# Patient Record
Sex: Male | Born: 1980 | Race: Black or African American | Hispanic: No | Marital: Single | Smoking: Current every day smoker
Health system: Southern US, Community
[De-identification: ages and names within clinical notes are randomized; demographics above are authoritative.]

## PROBLEM LIST (undated history)

## (undated) DIAGNOSIS — M549 Dorsalgia, unspecified: Secondary | ICD-10-CM

## (undated) HISTORY — PX: HERNIA REPAIR: SHX51

## (undated) HISTORY — DX: Dorsalgia, unspecified: M54.9

---
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
## 2000-12-28 ENCOUNTER — Emergency Department (HOSPITAL_COMMUNITY): Admission: EM | Admit: 2000-12-28 | Discharge: 2000-12-28 | Payer: Self-pay | Admitting: Emergency Medicine

## 2002-07-22 ENCOUNTER — Emergency Department (HOSPITAL_COMMUNITY): Admission: EM | Admit: 2002-07-22 | Discharge: 2002-07-22 | Payer: Self-pay | Admitting: Emergency Medicine

## 2003-02-08 ENCOUNTER — Emergency Department (HOSPITAL_COMMUNITY): Admission: EM | Admit: 2003-02-08 | Discharge: 2003-02-08 | Payer: Self-pay | Admitting: Emergency Medicine

## 2004-08-28 ENCOUNTER — Emergency Department (HOSPITAL_COMMUNITY): Admission: EM | Admit: 2004-08-28 | Discharge: 2004-08-28 | Payer: Self-pay | Admitting: Emergency Medicine

## 2005-02-18 ENCOUNTER — Emergency Department (HOSPITAL_COMMUNITY): Admission: EM | Admit: 2005-02-18 | Discharge: 2005-02-18 | Payer: Self-pay | Admitting: Emergency Medicine

## 2005-06-17 ENCOUNTER — Emergency Department (HOSPITAL_COMMUNITY): Admission: EM | Admit: 2005-06-17 | Discharge: 2005-06-17 | Payer: Self-pay | Admitting: Emergency Medicine

## 2007-01-10 ENCOUNTER — Emergency Department (HOSPITAL_COMMUNITY): Admission: EM | Admit: 2007-01-10 | Discharge: 2007-01-10 | Payer: Self-pay | Admitting: Emergency Medicine

## 2008-06-27 ENCOUNTER — Emergency Department (HOSPITAL_COMMUNITY): Admission: EM | Admit: 2008-06-27 | Discharge: 2008-06-27 | Payer: Self-pay | Admitting: Family Medicine

## 2012-10-28 ENCOUNTER — Encounter (HOSPITAL_COMMUNITY): Payer: Self-pay | Admitting: Emergency Medicine

## 2012-10-28 ENCOUNTER — Emergency Department (HOSPITAL_COMMUNITY)
Admission: EM | Admit: 2012-10-28 | Discharge: 2012-10-28 | Disposition: A | Discharge status: Home / Self Care | Payer: BC Managed Care – PPO | Attending: Emergency Medicine | Admitting: Emergency Medicine

## 2012-10-28 ENCOUNTER — Emergency Department (HOSPITAL_COMMUNITY): Payer: BC Managed Care – PPO

## 2012-10-28 DIAGNOSIS — R079 Chest pain, unspecified: Secondary | ICD-10-CM | POA: Insufficient documentation

## 2012-10-28 DIAGNOSIS — F17200 Nicotine dependence, unspecified, uncomplicated: Secondary | ICD-10-CM | POA: Insufficient documentation

## 2012-10-28 DIAGNOSIS — R0602 Shortness of breath: Secondary | ICD-10-CM | POA: Insufficient documentation

## 2012-10-28 LAB — POCT I-STAT TROPONIN I: Troponin i, poc: 0.01 ng/mL (ref 0.00–0.08)

## 2012-10-28 LAB — CBC
HCT: 44.7 % (ref 39.0–52.0)
Hemoglobin: 16.3 g/dL (ref 13.0–17.0)
MCH: 32.5 pg (ref 26.0–34.0)
MCHC: 36.5 g/dL — ABNORMAL HIGH (ref 30.0–36.0)
MCV: 89.2 fL (ref 78.0–100.0)
Platelets: 237 10*3/uL (ref 150–400)
RBC: 5.01 MIL/uL (ref 4.22–5.81)
RDW: 12.6 % (ref 11.5–15.5)
WBC: 4.9 10*3/uL (ref 4.0–10.5)

## 2012-10-28 LAB — BASIC METABOLIC PANEL
BUN: 11 mg/dL (ref 6–23)
CO2: 28 mEq/L (ref 19–32)
Calcium: 9.5 mg/dL (ref 8.4–10.5)
Chloride: 101 mEq/L (ref 96–112)
Creatinine, Ser: 0.99 mg/dL (ref 0.50–1.35)
GFR calc Af Amer: >90 mL/min (ref >90)
GFR calc non Af Amer: >90 mL/min (ref >90)
Glucose, Bld: 86 mg/dL (ref 70–99)
Potassium: 3.9 mEq/L (ref 3.5–5.1)
Sodium: 138 mEq/L (ref 135–145)

## 2012-10-28 LAB — PRO B NATRIURETIC PEPTIDE: Pro B Natriuretic peptide (BNP): <5 pg/mL (ref 0–125)

## 2012-10-28 MED ORDER — SIMETHICONE 80 MG PO CHEW
80.0000 mg | CHEWABLE_TABLET | Freq: Once | ORAL | Status: AC
  Administered 2012-10-28: 80 mg via ORAL
  Filled 2012-10-28: qty 1

## 2012-10-28 MED ORDER — GI COCKTAIL ~~LOC~~
30.0000 mL | Freq: Once | ORAL | Status: AC
  Administered 2012-10-28: 30 mL via ORAL
  Filled 2012-10-28: qty 30

## 2012-10-28 NOTE — ED Provider Notes (Signed)
  Medical screening examination/treatment/procedure(s) were performed by non-physician practitioner and as supervising physician I was immediately available for consultation/collaboration.    Gerhard Munch, MD 10/28/12 [phone]

## 2012-10-28 NOTE — ED Notes (Signed)
Patient states that he came into ED with chest pain that is now mostly resolved.  Patient states if he presses on his L chest the pain is reproducible.  Patient does state that he has been under a lot of stress lately and wonders if that is a contributor.

## 2012-10-28 NOTE — ED Provider Notes (Signed)
CSN: [id number]     Arrival date & time 10/28/12  0223 History     None    Chief Complaint  Patient presents with  . Chest Pain   (Consider location/radiation/quality/duration/timing/severity/associated sxs/prior Treatment) The history is provided by the patient and medical records.   Patient presents today for left-sided chest pain which woke him from sleep. Pain described as an intermittent sharp, stabbing sensation associated with some SOB when pain occurs.  Episodes are short lived, lasting on a few seconds before resolving.  No palpitations, dizziness, weakness, nausea, vomiting, or diaphoresis.  Patient notes he has had increased indigestion and gas recently due to changes in diet.  No significant personal or family history of coronary disease or MI.  Is a current every day smoker.  No prior episodes of similar pain. No recent travel or LE edema. No strenuous active to cause muscle strain.  No falls or trauma to chest wall. No meds taken prior to arrival.  History reviewed. No pertinent past medical history. History reviewed. No pertinent past surgical history. No family history on file. History  Substance Use Topics  . Smoking status: Current Every Day Smoker  . Smokeless tobacco: Not on file  . Alcohol Use: Yes    Review of Systems  Cardiovascular: Positive for chest pain.  All other systems reviewed and are negative.    Allergies  Review of patient's allergies indicates no known allergies.  Home Medications  No current outpatient prescriptions on file. BP 133/96  Pulse 66  Temp(Src) 98.1 F (36.7 C) (Oral)  Resp 21  SpO2 100% Physical Exam  Nursing note and vitals reviewed. Constitutional: He is oriented to person, place, and time. He appears well-developed and well-nourished.  HENT:  Head: Normocephalic and atraumatic.  Mouth/Throat: Oropharynx is clear and moist.  Eyes: Conjunctivae and EOM are normal. Pupils are equal, round, and reactive to light.  Neck:  Normal range of motion.  Cardiovascular: Normal rate, regular rhythm and normal heart sounds.   Pulmonary/Chest: Effort normal and breath sounds normal. He exhibits tenderness. He exhibits no bony tenderness, no crepitus, no deformity, no swelling and no retraction.    Chest pain reproducible with palpation to left chest wall  Abdominal: Soft. Bowel sounds are normal. There is tenderness in the left upper quadrant. There is no rigidity, no guarding, no CVA tenderness, no tenderness at McBurney's point and negative Murphy's sign.  Musculoskeletal: Normal range of motion. He exhibits no edema.  Neurological: He is alert and oriented to person, place, and time.  Skin: Skin is warm and dry.  Psychiatric: He has a normal mood and affect.    ED Course   Procedures (including critical care time)   Date: 10/28/2012  Rate: 82  Rhythm: normal sinus rhythm  QRS Axis: normal  Intervals: normal  ST/T Wave abnormalities: normal  Conduction Disutrbances:none  Narrative Interpretation: NSR, no STEMI  Old EKG Reviewed: none available    Labs Reviewed  CBC - Abnormal; Notable for the following:    MCHC 36.5 (*)    All other components within normal limits  BASIC METABOLIC PANEL  PRO B NATRIURETIC PEPTIDE  POCT I-STAT TROPONIN I   Dg Chest 2 View  10/28/2012   *RADIOLOGY REPORT*  Clinical Data: Chest pain.  Shortness of breath.  CHEST - 2 VIEW  Comparison: None.  Findings: Lungs are clear.  Heart size is normal.  No pneumothorax or pleural fluid.  IMPRESSION: Negative chest.   Original Report Authenticated By: Holley Dexter,  M.D.   1. Chest pain     MDM   EKG NSR, no acute ischemic changes.  Trop negative.  CXR clear.  Labs largely WNL.  Chest pain reproducible and with pts mention of recent indigestion and diet changes, i suspect that pain may be GI related.  7:49 AM Pt states pain has improved with GI cocktail and Gas-x.  Low suspicion that CP is cardiac in nature-- doubt ACS, PE,  dissection, or other vascular collapse.  Pt does not have PCP-- given names of local offices and contact info for wellness clinic where he may FU.  Instructed he may continue taking OTC meds for sx.  Discussed plan with pt, he agreed.  Return precautions advised.   Garlon Hatchet, PA-C 10/28/12 1013

## 2012-10-28 NOTE — ED Notes (Signed)
PT. WOKE UP FROM SLEEP WITH LEFT CHEST PAIN , SOB , OCCASIONAL DRY COUGH AND NAUSEA THIS EVENING , PT. TOOK EXEDRIN TABS WITH NO RELIEF.

## 2014-12-01 ENCOUNTER — Encounter (HOSPITAL_COMMUNITY): Payer: Self-pay

## 2014-12-01 ENCOUNTER — Emergency Department (HOSPITAL_COMMUNITY)
Admission: EM | Admit: 2014-12-01 | Discharge: 2014-12-01 | Disposition: A | Discharge status: Home / Self Care | Payer: Medicaid Other | Attending: Emergency Medicine | Admitting: Emergency Medicine

## 2014-12-01 DIAGNOSIS — Z7982 Long term (current) use of aspirin: Secondary | ICD-10-CM | POA: Insufficient documentation

## 2014-12-01 DIAGNOSIS — R1032 Left lower quadrant pain: Secondary | ICD-10-CM | POA: Insufficient documentation

## 2014-12-01 DIAGNOSIS — Z720 Tobacco use: Secondary | ICD-10-CM | POA: Insufficient documentation

## 2014-12-01 LAB — URINALYSIS, ROUTINE W REFLEX MICROSCOPIC
Bilirubin Urine: NEGATIVE
Glucose, UA: NEGATIVE mg/dL
Hgb urine dipstick: NEGATIVE
Ketones, ur: NEGATIVE mg/dL
Leukocytes, UA: NEGATIVE
Nitrite: NEGATIVE
Protein, ur: NEGATIVE mg/dL
Specific Gravity, Urine: 1.022 (ref 1.005–1.030)
Urobilinogen, UA: 0.2 mg/dL (ref 0.0–1.0)
pH: 5.5 (ref 5.0–8.0)

## 2014-12-01 LAB — COMPREHENSIVE METABOLIC PANEL
ALT: 38 U/L (ref 17–63)
AST: 31 U/L (ref 15–41)
Albumin: 4.3 g/dL (ref 3.5–5.0)
Alkaline Phosphatase: 40 U/L (ref 38–126)
Anion gap: 9 (ref 5–15)
BUN: 9 mg/dL (ref 6–20)
CO2: 24 mmol/L (ref 22–32)
Calcium: 9.6 mg/dL (ref 8.9–10.3)
Chloride: 106 mmol/L (ref 101–111)
Creatinine, Ser: 1.03 mg/dL (ref 0.61–1.24)
GFR calc Af Amer: >60 mL/min (ref >60)
GFR calc non Af Amer: >60 mL/min (ref >60)
Glucose, Bld: 98 mg/dL (ref 65–99)
Potassium: 4.2 mmol/L (ref 3.5–5.1)
Sodium: 139 mmol/L (ref 135–145)
Total Bilirubin: 0.7 mg/dL (ref 0.3–1.2)
Total Protein: 6.9 g/dL (ref 6.5–8.1)

## 2014-12-01 LAB — CBC
HCT: 41.7 % (ref 39.0–52.0)
Hemoglobin: 14.3 g/dL (ref 13.0–17.0)
MCH: 31.4 pg (ref 26.0–34.0)
MCHC: 34.3 g/dL (ref 30.0–36.0)
MCV: 91.4 fL (ref 78.0–100.0)
Platelets: 228 10*3/uL (ref 150–400)
RBC: 4.56 MIL/uL (ref 4.22–5.81)
RDW: 12.7 % (ref 11.5–15.5)
WBC: 3.3 10*3/uL — ABNORMAL LOW (ref 4.0–10.5)

## 2014-12-01 LAB — LIPASE, BLOOD: Lipase: 25 U/L (ref 22–51)

## 2014-12-01 MED ORDER — IBUPROFEN 400 MG PO TABS
400.0000 mg | ORAL_TABLET | Freq: Once | ORAL | Status: AC
  Administered 2014-12-01: 400 mg via ORAL
  Filled 2014-12-01: qty 1

## 2014-12-01 MED ORDER — FAMOTIDINE 20 MG PO TABS: 20.0000 mg | ORAL_TABLET | Freq: Two times a day (BID) | ORAL | Status: DC

## 2014-12-01 MED ORDER — FAMOTIDINE 20 MG PO TABS
20.0000 mg | ORAL_TABLET | Freq: Once | ORAL | Status: AC
  Administered 2014-12-01: 20 mg via ORAL
  Filled 2014-12-01: qty 1

## 2014-12-01 NOTE — ED Notes (Signed)
Pt presents with 3 day h/o L sided abdominal pain.  Pt reports pain is constant and will have generalized cramping intermittently.  Pt denies any nausea or vomiting, reports onset of diarrhea today.  Pt denies any dysuria.

## 2014-12-01 NOTE — ED Provider Notes (Signed)
CSN: [id number]     Arrival date & time 12/01/14  1412 History   First MD Initiated Contact with Patient 12/01/14 1425     Chief Complaint  Patient presents with  . Abdominal Pain     (Consider location/radiation/quality/duration/timing/severity/associated sxs/prior Treatment) HPI Patient portion about 3 days she's had pain on the left side of his abdomen. It has been coming and going. It is crampy in nature. He reports initially he thought he had some constipation. He however ate fruit and had a bowel movement yesterday. He reports he took some Pepto-Bismol last night and then had a bowel movement again today. He denies any fever. There is been no pain burning or urgency urination. He denies left flank pain or testicular pain or swelling. History reviewed. No pertinent past medical history. Past Surgical History  Procedure Laterality Date  . Hernia repair     History reviewed. No pertinent family history. Social History  Substance Use Topics  . Smoking status: Current Every Day Smoker  . Smokeless tobacco: None  . Alcohol Use: Yes    Review of Systems 10 Systems reviewed and are negative for acute change except as noted in the HPI.    Allergies  Review of patient's allergies indicates no known allergies.  Home Medications   Prior to Admission medications   Medication Sig Start Date End Date Taking? Authorizing Provider  aspirin-acetaminophen-caffeine (EXCEDRIN MIGRAINE) [phone] MG per tablet Take 2 tablets by mouth every 6 (six) hours as needed for headache.   Yes Historical Provider, MD  bismuth subsalicylate (PEPTO BISMOL) 262 MG/15ML suspension Take 30 mLs by mouth every 6 (six) hours as needed for indigestion.   Yes Historical Provider, MD  famotidine (PEPCID) 20 MG tablet Take 1 tablet (20 mg total) by mouth 2 (two) times daily. 12/01/14   Arby Barrette, MD   BP 119/89 mmHg  Pulse 94  Temp(Src) 98.8 F (37.1 C)  Resp 20  SpO2 98% Physical Exam  Constitutional:  He is oriented to person, place, and time. He appears well-developed and well-nourished.  HENT:  Head: Normocephalic and atraumatic.  Eyes: EOM are normal. Pupils are equal, round, and reactive to light.  Neck: Neck supple.  Cardiovascular: Normal rate, regular rhythm, normal heart sounds and intact distal pulses.   Pulmonary/Chest: Effort normal and breath sounds normal.  Abdominal: Soft. Bowel sounds are normal. He exhibits no distension. There is tenderness.  Mildly reproducible tenderness in the left mid quadrant. No guarding no rebound. No palpable mass. No CVA tenderness.  Musculoskeletal: Normal range of motion. He exhibits no edema.  Neurological: He is alert and oriented to person, place, and time. He has normal strength. Coordination normal. GCS eye subscore is 4. GCS verbal subscore is 5. GCS motor subscore is 6.  Skin: Skin is warm, dry and intact.  Psychiatric: He has a normal mood and affect.    ED Course  Procedures (including critical care time) Labs Review Labs Reviewed  CBC - Abnormal; Notable for the following:    WBC 3.3 (*)    All other components within normal limits  LIPASE, BLOOD  COMPREHENSIVE METABOLIC PANEL  URINALYSIS, ROUTINE W REFLEX MICROSCOPIC (NOT AT [facility])    Imaging Review No results found. I have personally reviewed and evaluated these images and lab results as part of my medical decision-making.   EKG Interpretation None      MDM   Final diagnoses:  Left lower quadrant pain   Patient with several days of left lateral  abdominal pain. At this point time exam is nonsurgical. Patient has not had nausea or vomiting. He is requesting food at this time. This is most likely GI in etiology such as gastritis or colicky pain. Patient is given signs and symptoms for which return.    Arby Barrette, MD 12/01/14 [phone]

## 2014-12-01 NOTE — Discharge Instructions (Signed)

## 2015-05-01 ENCOUNTER — Encounter (HOSPITAL_COMMUNITY): Payer: Self-pay | Admitting: *Deleted

## 2015-05-01 ENCOUNTER — Emergency Department (HOSPITAL_COMMUNITY): Payer: Self-pay

## 2015-05-01 ENCOUNTER — Emergency Department (HOSPITAL_COMMUNITY)
Admission: EM | Admit: 2015-05-01 | Discharge: 2015-05-01 | Disposition: A | Discharge status: Home / Self Care | Payer: Self-pay | Attending: Emergency Medicine | Admitting: Emergency Medicine

## 2015-05-01 DIAGNOSIS — Y92410 Unspecified street and highway as the place of occurrence of the external cause: Secondary | ICD-10-CM | POA: Insufficient documentation

## 2015-05-01 DIAGNOSIS — F17200 Nicotine dependence, unspecified, uncomplicated: Secondary | ICD-10-CM | POA: Insufficient documentation

## 2015-05-01 DIAGNOSIS — S4992XA Unspecified injury of left shoulder and upper arm, initial encounter: Secondary | ICD-10-CM | POA: Insufficient documentation

## 2015-05-01 DIAGNOSIS — Y999 Unspecified external cause status: Secondary | ICD-10-CM | POA: Insufficient documentation

## 2015-05-01 DIAGNOSIS — Z79899 Other long term (current) drug therapy: Secondary | ICD-10-CM | POA: Insufficient documentation

## 2015-05-01 DIAGNOSIS — Y9389 Activity, other specified: Secondary | ICD-10-CM | POA: Insufficient documentation

## 2015-05-01 DIAGNOSIS — M25512 Pain in left shoulder: Secondary | ICD-10-CM

## 2015-05-01 MED ORDER — IBUPROFEN 600 MG PO TABS: 600.0000 mg | ORAL_TABLET | Freq: Four times a day (QID) | ORAL | Status: DC | PRN

## 2015-05-01 NOTE — ED Notes (Signed)
Patient transported to X-ray 

## 2015-05-01 NOTE — ED Notes (Signed)
Declined W/C at D/C and was escorted to lobby by RN. 
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
## 2015-05-01 NOTE — Discharge Instructions (Signed)
1. Medications: ibuprofen, usual home medications 2. Treatment: rest, drink plenty of fluids, ice, elevate, wear sling for comfort 3. Follow Up: please followup with your primary doctor and with orthopedics for discussion of your diagnoses and further evaluation after today's visit; if you do not have a primary care doctor use the resource guide provided to find one; please return to the ER for increased pain, swelling, numbness, new or worsening symptoms   Shoulder Pain The shoulder is the joint that connects your arms to your body. The bones that form the shoulder joint include the upper arm bone (humerus), the shoulder blade (scapula), and the collarbone (clavicle). The top of the humerus is shaped like a ball and fits into a rather flat socket on the scapula (glenoid cavity). A combination of muscles and strong, fibrous tissues that connect muscles to bones (tendons) support your shoulder joint and hold the ball in the socket. Small, fluid-filled sacs (bursae) are located in different areas of the joint. They act as cushions between the bones and the overlying soft tissues and help reduce friction between the gliding tendons and the bone as you move your arm. Your shoulder joint allows a wide range of motion in your arm. This range of motion allows you to do things like scratch your back or throw a ball. However, this range of motion also makes your shoulder more prone to pain from overuse and injury. Causes of shoulder pain can originate from both injury and overuse and usually can be grouped in the following four categories:  Redness, swelling, and pain (inflammation) of the tendon (tendinitis) or the bursae (bursitis).  Instability, such as a dislocation of the joint.  Inflammation of the joint (arthritis).  Broken bone (fracture). HOME CARE INSTRUCTIONS   Apply ice to the sore area.  Put ice in a plastic bag.  Place a towel between your skin and the bag.  Leave the ice on for 15-20  minutes, 3-4 times per day for the first 2 days, or as directed by your health care provider.  Stop using cold packs if they do not help with the pain.  If you have a shoulder sling or immobilizer, wear it as long as your caregiver instructs. Only remove it to shower or bathe. Move your arm as little as possible, but keep your hand moving to prevent swelling.  Squeeze a soft ball or foam pad as much as possible to help prevent swelling.  Only take over-the-counter or prescription medicines for pain, discomfort, or fever as directed by your caregiver. SEEK MEDICAL CARE IF:   Your shoulder pain increases, or new pain develops in your arm, hand, or fingers.  Your hand or fingers become cold and numb.  Your pain is not relieved with medicines. SEEK IMMEDIATE MEDICAL CARE IF:   Your arm, hand, or fingers are numb or tingling.  Your arm, hand, or fingers are significantly swollen or turn white or blue. MAKE SURE YOU:   Understand these instructions.  Will watch your condition.  Will get help right away if you are not doing well or get worse.   This information is not intended to replace advice given to you by your health care provider. Make sure you discuss any questions you have with your health care provider.   Document Released: 12/19/2004 Document Revised: 04/01/2014 Document Reviewed: 07/04/2014 Elsevier Interactive Patient Education 2016 [employer].   Emergency Department Resource Guide 1) Find a Doctor and Pay Out of Pocket Although you won't have to  find out who is covered by your insurance plan, it is a good idea to ask around and get recommendations. You will then need to call the office and see if the doctor you have chosen will accept you as a new patient and what types of options they offer for patients who are self-pay. Some doctors offer discounts or will set up payment plans for their patients who do not have insurance, but you will need to ask so you aren't  surprised when you get to your appointment.  2) Contact Your Local Health Department Not all health departments have doctors that can see patients for sick visits, but many do, so it is worth a call to see if yours does. If you don't know where your local health department is, you can check in your phone book. The CDC also has a tool to help you locate your state's health department, and many state websites also have listings of all of their local health departments.  3) Find a Walk-in Clinic If your illness is not likely to be very severe or complicated, you may want to try a walk in clinic. These are popping up all over the country in pharmacies, drugstores, and shopping centers. They're usually staffed by nurse practitioners or physician assistants that have been trained to treat common illnesses and complaints. They're usually fairly quick and inexpensive. However, if you have serious medical issues or chronic medical problems, these are probably not your best option.  No Primary Care Doctor: - Call Health Connect at  [phone] - they can help you locate a primary care doctor that  accepts your insurance, provides certain services, etc. - Physician Referral Service- [phone]  Chronic Pain Problems: Organization         Address  Phone   Notes  [facility]  [phone] Patients need to be referred by their primary care doctor.   Medication Assistance: Organization         Address  Phone   Notes  [facility] Medication [facility] [address] [place], [place] [address] [phone] --Must be a resident of [facility] -- Must have NO insurance coverage whatsoever (no Medicaid/ Medicare, etc.) -- The pt. MUST have a primary care doctor that directs their care regularly and follows them in the community   MedAssist  [phone]   [employer]  [phone]    Agencies that provide inexpensive medical care: Organization          Address  Phone   Notes  [facility]  [phone]   [facility]    [phone]   [facility] [address] [place], [place] [address] [phone]   [facility] [address] [place] [place] [phone]   Planned Parenthood    [phone]   [facility]    [phone]   [facility]  [address], [place] Phone:  [phone], Fax:  [phone] Hours of Operation:  9 am - 6 pm, M-F.  Also accepts Medicaid/Medicare and self-pay.  [facility]  [address], [place] Phone: [phone], Fax: [phone]. Hours of Operation:  8:30 am - 5:30 pm, M-F.  Also accepts Medicaid and self-pay.  [facility] [place] [address] [employer] Phone: [phone]   [facility] [address] [place], [place]  [place], [place] [phone], Ext. [phone] Mondays & Thursdays: 7-9 AM.  First 15 patients are seen on a first come, first serve basis.    Medicaid-accepting [facility] Providers:  Organization         Address  Phone   Notes  [facility] [address],  [phone] Also accepts self-pay patients.  [facility] [address] Laurell Josephs [place], [place]  [phone]   [facility] [address], [place] [phone]   [facility] [address] [place] [phone]   Renaye Rakers [address], [place]   [phone] Only accepts [place] Access [place] patients after they have their name applied to their card.   Self-Pay (no insurance) in [facility]:  Organization         Address  Phone   Notes  Sickle Cell Patients, [facility] [address] [place], [place] [phone]   [facility] [address] [place], [place] [phone]     [facility]  [address], [place] [phone]   [facility]/Dr. Osei-Bonsu  [address] [place] or [address], [place] [phone] Phone number for both [place] and [place] locations is the same.  [facility] [address] [place] [phone]   [facility] [address], [place] or [address] [phone]   [facility] [address] [place] [phone], phone; [phone], fax Sees patients 1st and 3rd Saturday of every month.  Must not qualify for public or private insurance (i.e. Medicaid, Medicare, [place] Health Choice, Veterans' Benefits)  Household income should be no more than 200% of the poverty level The clinic cannot treat you if you are pregnant or think you are pregnant  Sexually transmitted diseases are not treated at the clinic.    Dental Care: Organization         Address  Phone  Notes  [facility] Department of [facility] [address] [place], [place] [phone] Accepts children up to age 34 who are enrolled in [place] or [place] Health Choice; pregnant women with a Medicaid card; and children who have applied for Medicaid or [place] Health Choice, but were declined, whose parents can pay a reduced fee at time of service.  [facility] Department of [facility]  [address], [place] [phone] Accepts children up to age 28 who are enrolled in [place] or [place] Health Choice; pregnant women with a Medicaid card; and children who have applied for Medicaid or  Health Choice, but were declined, whose parents can pay a reduced fee at time of service.  [facility]  [address] [place], [place] [phone] Patients are seen by appointment only. Walk-ins are not accepted. [facility] will see patients 66  years of age and older. Monday - Tuesday (8am-5pm) Most Wednesdays (8:30-5pm) $30 per visit, cash only  [facility] Adult Dental Access PRO[facility]  [address], [facility] [phone] Patients are seen by appointment only. Walk-ins are not accepted. [facility] will see patients 48 years of age and older. One Wednesday Evening (Monthly: Volunteer Based).  $30 per visit, cash only  [employer] of Dentistry  Clinics  [phone] for adults; Children under age 27, call Graduate Pediatric Dentistry at [phone]. Children aged 41-14, please call [phone] to request a pediatric application.  Dental services are provided in all areas of dental care including fillings, crowns and bridges, complete and partial dentures, implants, gum treatment, root canals, and extractions. Preventive care is also provided. Treatment is provided to both adults and children. Patients are selected via a lottery and there is often a waiting list.   [facility] [address], [place]  [phone] [email or web address]   [facility] [address] [place], [place] [phone], Ext. [phone] Second and Fourth Thursday of each month, opens at 6:30 AM; Clinic ends at 9 AM.  Patients are seen on a first-come first-served basis, and a limited number are seen during each clinic.   [facility]  [address] Ether Griffins [place], [place] [phone]   Eligibility Requirements You must have lived in [place], [place], or [place] counties for at least the last three months.   You cannot be eligible for state or federal sponsored [place], including [employer], [place], or [employer].   You generally cannot be eligible for healthcare insurance through your employer.    How to apply: Eligibility screenings are held every Tuesday and Wednesday afternoon from 1:00 pm until 4:00 pm. You do not need an appointment for the interview!  [facility]  [address] [place], [place] [phone]   [facility] Health Department  [phone]   [facility] Health Department  [phone]   [facility]  [phone]    Behavioral Health Resources in the Community: Intensive Outpatient Programs Organization         Address  Phone  Notes  [facility] [address] [place], [place] [phone]   [facility] Out[facility] [address] [place], [place] [phone]   [facility] [address] [place], [place]  [phone]   [facility] [address],  [place], [place] [phone] or [phone]   Substance Abuse Resources Organization         Address  Phone  Notes  [facility]  [phone]   [facility]  [phone]   [facility]  [phone]   Floydene Flock  [phone]   Residential & Outpatient Substance Abuse Program  [phone]   Psychological Services Organization         Address  Phone  Notes  [facility]  [phone]   [facility]  [phone]   [facility] Mental Health [address], [place] [phone] or [phone]    Mobile Crisis Teams Organization         Address  Phone  Notes  [facility], Mobile Crisis Care Unit  [phone]   [facility]  [address] [place], [place] [phone]   Doristine Locks [address] [place] [phone]    Self-Help/Support Groups Organization         Address  Phone             Notes  [facility] [place] - variety of support groups  336- I7437963 Call for more information  Narcotics Anonymous (NA), [facility] [address], [employer] [place]  2 meetings at this location   Chief Executive Officer  Notes  [facility] [address]    [place]   [phone]   [facility]  [address], [place] [address], [place], [place] [phone]   [facility] [address] [place], [place] [phone] Admissions: 8am-3pm M-F  [facility] [address]    [place], [place] [phone]   [facility] [address] [place], [place], [place] [phone]   [facility] [address],  [place], [place] [phone]   [facility] - Intensive Outpatient [address] Laurell Josephs [address], [place], [place] [phone]   [facility] ([facility] [address]  [place], [place] [phone] or [phone]   [facility] ([facility]) [address] [place], [place] [phone] Accepts Medicaid  [facility] [address],  [place] [phone] Substance Abuse/Addiction Treatment   [facility] Organization         Address  Phone  Notes  [facility]  [phone]   Angie Fava, PhD [address] Ervin Knack [place], [place]   [phone] or [phone]   [facility] Behavioral   [address] [place], [place] [phone]   [facility] [address], [place], [place] [phone] Insurance/Medicaid/sponsorship through [facility] and [facility] [address]                                    [place], [place] [phone] Therapy/tele-psych/case  [facility] [address], [place] [phone]    Dr. Lolly Mustache  [phone]   [facility]  [employer] [facility] Dept. 1) [address] [place] 2) [address] [place] 3)  [address], [place] [phone]  [phone]   [facility] Child Abuse Hotline [phone] or 8053328518 (After Hours)

## 2017-03-16 ENCOUNTER — Other Ambulatory Visit: Payer: Self-pay

## 2017-03-16 ENCOUNTER — Encounter (HOSPITAL_COMMUNITY): Payer: Self-pay | Admitting: Emergency Medicine

## 2017-03-16 ENCOUNTER — Emergency Department (HOSPITAL_COMMUNITY): Payer: Self-pay

## 2017-03-16 ENCOUNTER — Emergency Department (HOSPITAL_COMMUNITY)
Admission: EM | Admit: 2017-03-16 | Discharge: 2017-03-16 | Disposition: A | Discharge status: Home / Self Care | Payer: Self-pay | Attending: Emergency Medicine | Admitting: Emergency Medicine

## 2017-03-16 DIAGNOSIS — F17200 Nicotine dependence, unspecified, uncomplicated: Secondary | ICD-10-CM | POA: Insufficient documentation

## 2017-03-16 DIAGNOSIS — R102 Pelvic and perineal pain: Secondary | ICD-10-CM | POA: Insufficient documentation

## 2017-03-16 LAB — CBC WITH DIFFERENTIAL/PLATELET
Basophils Absolute: 0 10*3/uL (ref 0.0–0.1)
Basophils Relative: 0 %
Eosinophils Absolute: 0.1 10*3/uL (ref 0.0–0.7)
Eosinophils Relative: 1 %
HEMATOCRIT: 44.2 % (ref 39.0–52.0)
Hemoglobin: 15.1 g/dL (ref 13.0–17.0)
LYMPHS PCT: 38 %
Lymphs Abs: 2 10*3/uL (ref 0.7–4.0)
MCH: 31.9 pg (ref 26.0–34.0)
MCHC: 34.2 g/dL (ref 30.0–36.0)
MCV: 93.2 fL (ref 78.0–100.0)
MONO ABS: 0.3 10*3/uL (ref 0.1–1.0)
MONOS PCT: 6 %
NEUTROS ABS: 2.9 10*3/uL (ref 1.7–7.7)
Neutrophils Relative %: 55 %
Platelets: 239 10*3/uL (ref 150–400)
RBC: 4.74 MIL/uL (ref 4.22–5.81)
RDW: 12.9 % (ref 11.5–15.5)
WBC: 5.3 10*3/uL (ref 4.0–10.5)

## 2017-03-16 LAB — I-STAT CG4 LACTIC ACID, ED: Lactic Acid, Venous: 0.88 mmol/L (ref 0.5–1.9)

## 2017-03-16 LAB — I-STAT CHEM 8, ED
BUN: 10 mg/dL (ref 6–20)
CREATININE: 0.8 mg/dL (ref 0.61–1.24)
Calcium, Ion: 1.26 mmol/L (ref 1.15–1.40)
Chloride: 101 mmol/L (ref 101–111)
Glucose, Bld: 91 mg/dL (ref 65–99)
HEMATOCRIT: 48 % (ref 39.0–52.0)
Hemoglobin: 16.3 g/dL (ref 13.0–17.0)
POTASSIUM: 4.4 mmol/L (ref 3.5–5.1)
Sodium: 140 mmol/L (ref 135–145)
TCO2: 27 mmol/L (ref 22–32)

## 2017-03-16 LAB — URINALYSIS, ROUTINE W REFLEX MICROSCOPIC
Bilirubin Urine: NEGATIVE
Glucose, UA: NEGATIVE mg/dL
Hgb urine dipstick: NEGATIVE
Ketones, ur: NEGATIVE mg/dL
LEUKOCYTES UA: NEGATIVE
NITRITE: NEGATIVE
PH: 6 (ref 5.0–8.0)
Protein, ur: NEGATIVE mg/dL
SPECIFIC GRAVITY, URINE: 1.024 (ref 1.005–1.030)

## 2017-03-16 MED ORDER — KETOROLAC TROMETHAMINE 30 MG/ML IJ SOLN
30.0000 mg | Freq: Once | INTRAMUSCULAR | Status: AC
  Administered 2017-03-16: 30 mg via INTRAVENOUS
  Filled 2017-03-16: qty 1

## 2017-03-16 MED ORDER — CIPROFLOXACIN HCL 500 MG PO TABS: 500.0000 mg | ORAL_TABLET | Freq: Two times a day (BID) | ORAL | 0 refills | Status: AC

## 2017-03-16 MED ORDER — IOPAMIDOL (ISOVUE-300) INJECTION 61%
INTRAVENOUS | Status: AC
  Administered 2017-03-16: 100 mL via INTRAVENOUS
  Filled 2017-03-16: qty 100

## 2017-03-16 NOTE — Discharge Instructions (Addendum)
Take 1 tablet of ciprofloxacin every 12 hours for the next 10 days.   You can treat your pain with 1000 mg of Tylenol once every 8 hours or 800 mg of ibuprofen every 8 hours.  Please make sure to take ibuprofen with food so that it does not upset your stomach.  Sitz baths are available over-the-counter and may help your symptoms.   If your symptoms do not start to improve in the next few weeks, please follow-up with your primary care appointment at your appointment in January.  If you develop any new or worsening symptoms, including blood in your urine, diarrhea, blood in your stool, severe abdominal or pelvic pain, fever, chills, or if the skin in the groin becomes red, hot, or swollen, please return to the emergency department for reevaluation.

## 2017-03-16 NOTE — ED Triage Notes (Addendum)
Patient c/o rectal pain x1 week after "falling on something at work." Describes pain as pressure. Denies bleeding and abdominal pain. Denies changes in bowel movements. Ambulatory.

## 2017-03-16 NOTE — ED Provider Notes (Signed)
[facility]-EMERGENCY DEPT Provider Note   CSN: [id number] Arrival date & time: 03/16/17  1138     History   Chief Complaint Chief Complaint  Patient presents with  . Rectal Pain    HPI Anthony Goodman is a 36 y.o. male with a chief complaint of perineal pain that began earlier this week without known trauma or injury.  He describes the pain as a dull, constant pressure that will intermittently radiate down to his medial thighs and stops mid-thigh.  He reports increased pressure with urination over the last 3 days.  He reports the pain worsened while he was at work last night after he slipped and fell and landed on his left buttock on a piece of scaffolding.  He states that after the fall that the pain became unbearable., 10/10.  He reports mild left-sided abdominal discomfort since this morning.   He denies urinary or fecal incontinence or perianal numbness.  No dysuria, frequency, hematuria, melena, hematochezia, diarrhea, or emesis.  No fever or chills.  No pain with bowel movements or straining.  The history is provided by the patient. No language interpreter was used.    History reviewed. No pertinent past medical history.  There are no active problems to display for this patient.   Past Surgical History:  Procedure Laterality Date  . HERNIA REPAIR         Home Medications    Prior to Admission medications   Medication Sig Start Date End Date Taking? Authorizing Provider  ibuprofen (ADVIL,MOTRIN) 200 MG tablet Take 200 mg by mouth every 6 (six) hours as needed for moderate pain.   Yes [provider]  ciprofloxacin (CIPRO) 500 MG tablet Take 1 tablet (500 mg total) by mouth 2 (two) times daily for 10 days. 03/16/17 03/26/17  Ezana Hubbert, Pedro EarlsMia A, PA-C    Family History No family history on file.  Social History Social History   Tobacco Use  . Smoking status: Current Every Day Smoker  Substance Use Topics  . Alcohol use: Yes  . Drug  use: No     Allergies   Patient has no known allergies.   Review of Systems Review of Systems  Constitutional: Negative for activity change, chills and fever.  Respiratory: Negative for shortness of breath.   Cardiovascular: Negative for chest pain.  Gastrointestinal: Positive for abdominal pain. Negative for vomiting.  Endocrine: Negative for polyuria.  Genitourinary: Positive for urgency. Negative for decreased urine volume, difficulty urinating, discharge, dysuria, flank pain, frequency, genital sores, hematuria, penile pain, penile swelling, scrotal swelling and testicular pain.  Musculoskeletal: Negative for back pain.  Skin: Negative for rash and wound.  Allergic/Immunologic: Negative for immunocompromised state.  Neurological: Negative for numbness.  Psychiatric/Behavioral: Negative for confusion.     Physical Exam Updated Vital Signs BP (!) 144/96 (BP Location: Left Arm)   Pulse 70   Temp 97.7 F (36.5 C) (Oral)   Resp 18   Ht 5\' 11"  (1.803 m)   Wt 89.8 kg (198 lb)   SpO2 99%   BMI 27.62 kg/m   Physical Exam  Constitutional: He appears well-developed.  HENT:  Head: Normocephalic.  Eyes: Conjunctivae are normal.  Neck: Neck supple.  Cardiovascular: Normal rate, regular rhythm, normal heart sounds and intact distal pulses. Exam reveals no gallop and no friction rub.  No murmur heard. Pulmonary/Chest: Effort normal and breath sounds normal. No stridor. No respiratory distress. He has no wheezes. He has no rales. He exhibits no tenderness.  Abdominal: Soft. He exhibits distension. He exhibits no mass. There is tenderness. There is no rebound and no guarding. No hernia. Hernia confirmed negative in the right inguinal area and confirmed negative in the left inguinal area.  Tender to palpation over the left lower quadrant.  No tenderness to palpation over the suprapubic area.  Negative Murphy sign.  No tenderness over McBurney's point.  No CVA tenderness bilaterally.   Hyperactive bowel sounds.  The abdomen appears mildly distended  Genitourinary: Testes normal and penis normal. Right testis shows no mass, no swelling and no tenderness. Left testis shows no mass, no swelling and no tenderness. No phimosis, paraphimosis, penile erythema or penile tenderness. No discharge found.  Genitourinary Comments: TTP over the perineum.  No inguinal lymphadenopathy.  No overlying erythema, edema, or warmth to the perineum or the inguinal or perianal area.  Penis is normal.  Testicles are unremarkable.  No external hemorrhoids noted on rectal exam.  No palpable internal hemorrhoids.  The prostate is nontender and is not boggy.  The rectum is normal.   Musculoskeletal:  No tenderness to palpation over the cervical, thoracic, or lumbar spinous processes or surrounding paraspinal muscles.  No reproducible tenderness to palpation over the musculature of the bilateral lumbar spine.  Bilateral gluteus maximus is nontender.  No tender to palpation over the muscles of the bilateral thighs.  No overlying erythema, edema or warmth.  Lymphadenopathy: No inguinal adenopathy noted on the right or left side.  Neurological: He is alert.  Skin: Skin is warm and dry.  Psychiatric: His behavior is normal.  Nursing note and vitals reviewed.    ED Treatments / Results  Labs (all labs ordered are listed, but only abnormal results are displayed) Labs Reviewed  URINE CULTURE  URINALYSIS, ROUTINE W REFLEX MICROSCOPIC  CBC WITH DIFFERENTIAL/PLATELET  I-STAT CHEM 8, ED  I-STAT CG4 LACTIC ACID, ED    EKG  EKG Interpretation None       Radiology Ct Pelvis W Contrast  Result Date: 03/16/2017 CLINICAL DATA:  Fall.  Pelvic pain. EXAM: CT PELVIS WITH CONTRAST TECHNIQUE: Multidetector CT imaging of the pelvis was performed using the standard protocol following the bolus administration of intravenous contrast. CONTRAST:  ISOVUE-300 IOPAMIDOL (ISOVUE-300) INJECTION 61% COMPARISON:   None. FINDINGS: Urinary Tract: Ureters are decompressed as is the urinary bladder. Grossly unremarkable. Bowel: Appendix normal. Visualized large and small bowel unremarkable. No obstruction. Vascular/Lymphatic: No aneurysm or adenopathy. Reproductive:  No visible focal abnormality. Other:  No free fluid or free air. Musculoskeletal: No bony abnormality.  No fracture. IMPRESSION: No acute findings in the pelvis. Electronically Signed   By: Charlett Nose M.D.   On: 03/16/2017 15:55    Procedures Procedures (including critical care time)  Medications Ordered in ED Medications  ketorolac (TORADOL) 30 MG/ML injection 30 mg (not administered)  iopamidol (ISOVUE-300) 61 % injection (100 mLs Intravenous Contrast Given 03/16/17 1542)     Initial Impression / Assessment and Plan / ED Course  I have reviewed the triage vital signs and the nursing notes.  Pertinent labs & imaging results that were available during my care of the patient were reviewed by me and considered in my medical decision making (see chart for details).  Clinical Course as of Mar 16 1632  Wynelle Link Mar 16, 2017  1446 Discussed the patient with Dr. Fayrene Fearing, attending physician.  Will order i-STAT Chem-8, CBC, and CT pelvis with contrast.  [MM]    Clinical Course User Index [MM] Kinleigh Nault A,  PA-C    36 year old male with no pertinent past medical history who is otherwise healthy presenting with atraumatic pain in the perineum x4 days with associated urinary pressure.  No constitutional symptoms.  On physical exam, pain is not reproducible.  No signs of cellulitis or soft tissue infection in the groin or perineum.  Digital rectal exam is unremarkable.  The prostate does not appear boggy or tender.  Urinalysis is unremarkable.  Discussed the patient with Dr. Fayrene FearingJames, attending physician.  Labs are unremarkable.  CT pelvis with contrast is normal.  Given the patient's symptoms, will empirically treat with ciprofloxacin for early prostatitis.   Toradol given for pain control.  He has an appointment scheduled with his PCP within the next month.  Recommended anti-inflammatories for pain control and sitz bath.  Strict return precautions given.  He is in no acute distress.  He is hemodynamically stable.  Will discharge the patient home at this time.  Final Clinical Impressions(s) / ED Diagnoses   Final diagnoses:  Perineum pain, male    ED Discharge Orders        Ordered    ciprofloxacin (CIPRO) 500 MG tablet  2 times daily     03/16/17 1624       Tannia Contino, Coral ElseMia A, PA-C 03/16/17 1634    Rolland PorterJames, Mark, MD 03/18/17 2125

## 2017-03-16 NOTE — ED Notes (Addendum)
Pt is alert and oriented x 4 and is verbally responsive. Pt report that he fell last night at work and states that he fell upon a scaffold that had some rings upon them. Pt reports 6/10 throbbing generalized rectum/sacral pain. Pt denies any break in the skin, denies swelling and redness.

## 2017-03-17 LAB — URINE CULTURE: Culture: NO GROWTH

## 2017-05-15 ENCOUNTER — Encounter (HOSPITAL_COMMUNITY): Payer: Self-pay | Admitting: Emergency Medicine

## 2017-05-15 ENCOUNTER — Emergency Department (HOSPITAL_COMMUNITY)
Admission: EM | Admit: 2017-05-15 | Discharge: 2017-05-15 | Disposition: A | Discharge status: Home / Self Care | Payer: 59 | Attending: Emergency Medicine | Admitting: Emergency Medicine

## 2017-05-15 ENCOUNTER — Other Ambulatory Visit: Payer: Self-pay

## 2017-05-15 ENCOUNTER — Emergency Department (HOSPITAL_COMMUNITY): Payer: 59

## 2017-05-15 DIAGNOSIS — R6883 Chills (without fever): Secondary | ICD-10-CM | POA: Insufficient documentation

## 2017-05-15 DIAGNOSIS — J111 Influenza due to unidentified influenza virus with other respiratory manifestations: Secondary | ICD-10-CM | POA: Diagnosis not present

## 2017-05-15 DIAGNOSIS — F17210 Nicotine dependence, cigarettes, uncomplicated: Secondary | ICD-10-CM | POA: Insufficient documentation

## 2017-05-15 DIAGNOSIS — R05 Cough: Secondary | ICD-10-CM | POA: Insufficient documentation

## 2017-05-15 DIAGNOSIS — M7918 Myalgia, other site: Secondary | ICD-10-CM | POA: Diagnosis not present

## 2017-05-15 DIAGNOSIS — R112 Nausea with vomiting, unspecified: Secondary | ICD-10-CM | POA: Diagnosis present

## 2017-05-15 LAB — INFLUENZA PANEL BY PCR (TYPE A & B)
INFLAPCR: NEGATIVE
Influenza B By PCR: NEGATIVE

## 2017-05-15 MED ORDER — ONDANSETRON 4 MG PO TBDP: ORAL_TABLET | ORAL | 0 refills | Status: DC

## 2017-05-15 MED ORDER — OSELTAMIVIR PHOSPHATE 75 MG PO CAPS: 75.0000 mg | ORAL_CAPSULE | Freq: Two times a day (BID) | ORAL | 0 refills | Status: DC

## 2017-05-15 MED ORDER — ONDANSETRON HCL 4 MG PO TABS
4.0000 mg | ORAL_TABLET | Freq: Once | ORAL | Status: AC
  Administered 2017-05-15: 4 mg via ORAL
  Filled 2017-05-15: qty 1

## 2017-05-15 MED ORDER — IBUPROFEN 800 MG PO TABS
800.0000 mg | ORAL_TABLET | Freq: Once | ORAL | Status: AC
  Administered 2017-05-15: 800 mg via ORAL
  Filled 2017-05-15: qty 1

## 2017-05-15 NOTE — ED Provider Notes (Signed)
Anthony Goodman   CSN: [id number] Arrival date & time: 05/15/17  0844     History   Chief Complaint Chief Complaint  Patient presents with  . Flu Like Symptoms    HPI Anthony Goodman is a 37 y.o. male otherwise healthy here presenting with chills, nausea, vomiting.  Patient states that he went to [place] last weekend and came back 3 days ago.  He has intermittent chills since then and since last night, he had double episodes of nausea and vomiting.  Also has some nonproductive cough as well.  He states that some his friends were sick but no known contact with anybody with flu.  He worked last night and had to get off of work early to come to the ED.  States that he is otherwise healthy and is not currently taking any medicines.  Denies any known fevers at home and no meds prior to arrival.  Did not get the flu shot this year.  The history is provided by the patient.    History reviewed. No pertinent past medical history.  There are no active problems to display for this patient.   Past Surgical History:  Procedure Laterality Date  . HERNIA REPAIR         Home Medications    Prior to Admission medications   Medication Sig Start Date End Date Taking? Authorizing Provider  ibuprofen (ADVIL,MOTRIN) 200 MG tablet Take 200 mg by mouth every 6 (six) hours as needed for moderate pain.   Yes [provider]    Family History No family history on file.  Social History Social History   Tobacco Use  . Smoking status: Current Every Day Smoker  Substance Use Topics  . Alcohol use: Yes  . Drug use: No     Allergies   Patient has no known allergies.   Review of Systems Review of Systems  Constitutional: Positive for chills.  Respiratory: Positive for cough.   Gastrointestinal: Positive for nausea.  Musculoskeletal: Positive for myalgias.  All other systems reviewed and are negative.    Physical  Exam Updated Vital Signs BP 122/87   Pulse 81   Temp 97.9 F (36.6 C) (Oral)   Resp 15   SpO2 100%   Physical Exam  Constitutional: He is oriented to person, place, and time. He appears well-developed and well-nourished.  HENT:  Head: Normocephalic.  Mouth/Throat: Oropharynx is clear and moist.  MM slightly dry   Eyes: Conjunctivae and EOM are normal. Pupils are equal, round, and reactive to light.  Neck: Normal range of motion. Neck supple.  No meningeal signs   Cardiovascular: Normal rate, regular rhythm and normal heart sounds.  Pulmonary/Chest: Effort normal.  Diminished bilateral bases, no obvious wheezing or crackles   Abdominal: Soft. Bowel sounds are normal. He exhibits no distension. There is no tenderness. There is no guarding.  Musculoskeletal: Normal range of motion.  Neurological: He is alert and oriented to person, place, and time.  Skin: Skin is warm.  Psychiatric: He has a normal mood and affect.  Nursing Goodman and vitals reviewed.    ED Treatments / Results  Labs (all labs ordered are listed, but only abnormal results are displayed) Labs Reviewed  INFLUENZA PANEL BY PCR (TYPE A & B)    EKG  EKG Interpretation None       Radiology Dg Chest 2 View  Result Date: 05/15/2017 CLINICAL DATA:  Cough, chills, nausea vomiting and diarrhea beginning a few  days ago. EXAM: CHEST  2 VIEW COMPARISON:  10/28/2012 FINDINGS: Heart size is normal. Mediastinal shadows are normal. The lungs are clear. No bronchial thickening. No infiltrate, mass, effusion or collapse. Pulmonary vascularity is normal. No bony abnormality. IMPRESSION: Normal chest Electronically Signed   By: Paulina FusiMark  Shogry M.D.   On: 05/15/2017 09:58    Procedures Procedures (including critical care time)  Medications Ordered in ED Medications  ondansetron (ZOFRAN) tablet 4 mg (4 mg Oral Given 05/15/17 1015)  ibuprofen (ADVIL,MOTRIN) tablet 800 mg (800 mg Oral Given 05/15/17 1015)     Initial  Impression / Assessment and Plan / ED Course  I have reviewed the triage vital signs and the nursing notes.  Pertinent labs & imaging results that were available during my care of the patient were reviewed by me and considered in my medical decision making (see chart for details).     Anthony Goodman is a 37 y.o. male here with cough, chills, vomiting. Likely gastro vs flu syndrome. Afebrile in the ED, well appearing. Will get CXR, swab for flu. Will give zofran, motrin and reassess.   10:59 AM CXR clear. Felt better after zofran, motrin. Able to keep fluids down. Flu pending. Will dc home with zofran, will prescribe tamiflu and he can fill it if flu positive.    Final Clinical Impressions(s) / ED Diagnoses   Final diagnoses:  None    ED Discharge Orders    None       Charlynne PanderYao, Joyia Riehle Hsienta, MD 05/15/17 1059

## 2017-05-15 NOTE — ED Triage Notes (Signed)
Pt complaint of cough, chills, and n/v/d onset a few days ago.

## 2017-05-15 NOTE — Discharge Instructions (Signed)
Stay hydrated.   Take zofran for nausea.   Take tylenol, motrin for myalgias.   Rest for 2 days.   You will be called if you have flu then you can fill the tamiflu prescription   See your doctor   Return to ER if you have trouble breathing, vomiting, dehydration.

## 2017-05-15 NOTE — ED Notes (Signed)
Patient provided with 2 cups of apple juice for fluid challenge. Patient tolerated well.

## 2017-05-15 NOTE — ED Notes (Signed)
Bed: WA03 Expected date:  Expected time:  Means of arrival:  Comments: 

## 2017-05-15 NOTE — ED Notes (Signed)
Bed: WTR8 Expected date:  Expected time:  Means of arrival:  Comments: 

## 2017-06-09 ENCOUNTER — Ambulatory Visit (INDEPENDENT_AMBULATORY_CARE_PROVIDER_SITE_OTHER): Payer: 59 | Admitting: Orthopaedic Surgery

## 2017-06-09 ENCOUNTER — Ambulatory Visit (INDEPENDENT_AMBULATORY_CARE_PROVIDER_SITE_OTHER): Payer: Self-pay

## 2017-06-09 ENCOUNTER — Encounter (INDEPENDENT_AMBULATORY_CARE_PROVIDER_SITE_OTHER): Payer: Self-pay | Admitting: Orthopaedic Surgery

## 2017-06-09 VITALS — BP 137/78 | HR 80 | Resp 14 | Ht 71.0 in | Wt 198.0 lb

## 2017-06-09 DIAGNOSIS — M5441 Lumbago with sciatica, right side: Secondary | ICD-10-CM

## 2017-06-09 DIAGNOSIS — M5442 Lumbago with sciatica, left side: Secondary | ICD-10-CM | POA: Diagnosis not present

## 2017-06-09 DIAGNOSIS — G8929 Other chronic pain: Secondary | ICD-10-CM

## 2017-06-09 MED ORDER — METHYLPREDNISOLONE 4 MG PO TBPK: ORAL_TABLET | ORAL | 0 refills | Status: DC

## 2017-06-09 NOTE — Progress Notes (Signed)
Office Visit Note   Patient: Anthony Goodman           Date of Birth: [birth year]           MRN: [id number] Visit Date: 06/09/2017              Requested by: No referring provider defined for this encounter. PCP: Patient, No Pcp Per   Assessment & Plan: Visit Diagnoses:  1. Chronic bilateral low back pain with bilateral sciatica     Plan: Chronic, recurrent low back pain with occasional referred discomfort as far distally as the left knee.  No right-sided symptoms.  No bowel or bladder dysfunction.  Pain seems to be related to arthritis.  Will try a Medrol Dosepak and a course of physical therapy.  If no improvement return in 1 month and consider MRI scan Follow-Up Instructions: Return in about 1 month (around 07/10/2017), or if no improvement.   Orders:  Orders Placed This Encounter  Procedures  . XR Pelvis 1-2 Views  . XR Lumbar Spine 2-3 Views  . Ambulatory referral to Physical Therapy   Meds ordered this encounter  Medications  . methylPREDNISolone (MEDROL DOSEPAK) 4 MG TBPK tablet    Sig: Take as directed    Dispense:  21 tablet    Refill:  0      Procedures: No procedures performed   Clinical Data: No additional findings.   Subjective: Chief Complaint  Patient presents with  . Lower Back - Pain  . Back Pain    Low back pain since August, 2018, weakness, numbness, sharp pain in groin, massage, chiropractor, worsening x 2 weeks, not diabetic, no surgery to back, no injury, difficulty sleeping at night, IBU helps some  Initial onset of low back pain in August 2018 without obvious injury or trauma.  Has had recurrent discomfort since that time with predominantly low back pain and some discomfort as far distally as his left knee.  No obvious numbness or tingling.  No bladder dysfunction.  No right-sided symptoms.  Tried over-the-counter medicines without much permanent relief.  No history of sickle cell disease.  Pain level is somewhere around a 7 or an 8 when it is  really uncomfortable and predominately and diffusely along the low back.  No groin pain  HPI  Review of Systems  Constitutional: Positive for fatigue.  HENT: Negative for trouble swallowing.   Eyes: Negative for pain.  Respiratory: Negative for shortness of breath.   Cardiovascular: Negative for leg swelling.  Gastrointestinal: Positive for constipation.  Endocrine: Negative for cold intolerance.  Genitourinary: Negative for difficulty urinating.  Musculoskeletal: Positive for back pain.  Skin: Negative for rash.  Allergic/Immunologic: Negative for food allergies.  Neurological: Positive for weakness and numbness.  Psychiatric/Behavioral: Positive for sleep disturbance.     Objective: Vital Signs: BP 137/78 (BP Location: Left Arm, Patient Position: Sitting, Cuff Size: Normal)   Pulse 80   Resp 14   Ht 5\' 11"  (1.803 m)   Wt 198 lb (89.8 kg)   BMI 27.62 kg/m   Physical Exam  Ortho Exam awake alert and oriented x3.  Comfortable sitting.  Straight leg raise minimally positive for low back pain on both the right and the left.  Able to raise her legs at least 90 degrees.  Painless range of motion of both hips with internal/external rotation flexion-extension.  Reflexes symmetrical.  Neurovascular exam intact.  No knee pain.  Some percussible tenderness diffusely along the lower lumbar spine.  Specialty  Comments:  No specialty comments available.  Imaging: Xr Lumbar Spine 2-3 Views  Result Date: 06/09/2017 2 views of the lumbar spine demonstrates straightening of the normal lordosis.  There is a decrease in the joint space at L5-S1.  No listhesis.  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  No ectopic calcification.  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  The sacroiliac joints intact    PMFS History: There are no active problems to display for this patient.  History reviewed. No pertinent past medical history.  History reviewed. No pertinent family history.  Past Surgical History:  Procedure Laterality Date  . HERNIA REPAIR     Social History   Occupational History  . Not on file  Tobacco Use  . Smoking status: Current Every Day Smoker    Packs/day: 0.50    Years: 10.00    Pack years: 5.00    Types: Cigarettes  . Smokeless tobacco: Never Used  Substance and Sexual Activity  . Alcohol use: Yes    Alcohol/week: 1.2 oz    Types: 2 Shots of liquor per week  . Drug use: No  . Sexual activity: Not on file

## 2017-06-16 ENCOUNTER — Other Ambulatory Visit: Payer: Self-pay

## 2017-06-16 ENCOUNTER — Encounter (HOSPITAL_COMMUNITY): Payer: Self-pay | Admitting: Emergency Medicine

## 2017-06-16 ENCOUNTER — Ambulatory Visit (HOSPITAL_COMMUNITY)
Admission: EM | Admit: 2017-06-16 | Discharge: 2017-06-16 | Disposition: A | Discharge status: Home / Self Care | Payer: 59 | Attending: Family Medicine | Admitting: Family Medicine

## 2017-06-16 DIAGNOSIS — M533 Sacrococcygeal disorders, not elsewhere classified: Secondary | ICD-10-CM

## 2017-06-16 MED ORDER — CYCLOBENZAPRINE HCL 10 MG PO TABS: 10.0000 mg | ORAL_TABLET | Freq: Two times a day (BID) | ORAL | 0 refills | Status: DC | PRN

## 2017-06-16 MED ORDER — NAPROXEN 500 MG PO TABS: 500.0000 mg | ORAL_TABLET | Freq: Two times a day (BID) | ORAL | 0 refills | Status: DC

## 2017-06-16 NOTE — ED Provider Notes (Signed)
  [facility]    CSN: [id number] Arrival date & time: 06/16/17  1947  Musculoskeletal Exam  Patient: Anthony KetoBradley L Heuer DOB: 07/08/1980  DOS: 06/16/2017  SUBJECTIVE:  Chief Complaint:   Chief Complaint  Patient presents with  . Back Pain    Anthony Goodman is a 37 y.o.  male for evaluation and treatment of his back pain.   Onset:  Several weeks, injured self on job around 2 years ago and never thinks he healed Location: lower Character:  aching and shooting  Progression of issue:  is unchanged Associated symptoms: radiates to legs Denies bowel/bladder incontinence or weakness Treatment: to date has been prescription NSAIDS. Helpful, has appt with urology and spine specialist, would like some pain medicine to hold him over. Neurovascular symptoms: no  ROS: Musculoskeletal/Extremities: +back pain Neurologic: no numbness, tingling no weakness   History reviewed. No pertinent past medical history.  Objective:  VITAL SIGNS: BP (!) 144/87 (BP Location: Left Arm)   Pulse 83   Temp 98.5 F (36.9 C) (Oral)   SpO2 100%  Constitutional: Well formed, well developed. No acute distress. HENT: Normocephalic, atraumatic.  Thorax & Lungs:  No accessory muscle use Extremities: No clubbing. No cyanosis. No edema.  Skin: Warm. Dry. No erythema. No rash.  Musculoskeletal: low back.   Normal active range of motion: yes.   Normal passive range of motion: yes Tenderness to palpation: yes over b/l SI jt and L glute Deformity: no Ecchymosis: no Straight leg test: negative for Neurologic: Normal sensory function. No focal deficits noted. DTR's equal and symmetry in LE's. No clonus. Psychiatric: Normal mood. Age appropriate judgment and insight. Alert & oriented x 3.    Assessment:  Sacroiliac joint pain  Plan: Encouraged to do exercises provided by PT. Heat, ice, Tylenol, Naproxen/Flexeril called in. F/u with specialists as scheduled. The patient voiced understanding  and agreement to the plan.     Sharlene DoryWendling, Meghan Tiemann Paul, [place]DO 06/16/17 2126

## 2017-06-16 NOTE — ED Triage Notes (Signed)
C/o lower back pain that has radiated to groin area onset one week, states fell in December off a scaffold while at work

## 2017-06-16 NOTE — Discharge Instructions (Addendum)
OK to take Tylenol 1000 mg (2 extra strength tabs) or 975 mg (3 regular strength tabs) every 6 hours as needed.  Heat (pad or rice pillow in microwave) over affected area, 10-15 minutes twice daily.   Ice/cold pack over area for 10-15 min twice daily.  Take Flexeril (cyclobenzaprine) 1-2 hours before planned bedtime. If it makes you drowsy, do not take during the day. You can try half a tab the following night.  Do the stretching diligently!

## 2017-11-24 ENCOUNTER — Emergency Department
Admission: EM | Admit: 2017-11-24 | Discharge: 2017-11-24 | Disposition: A | Discharge status: Home / Self Care | Payer: Self-pay | Attending: Emergency Medicine | Admitting: Emergency Medicine

## 2017-11-24 DIAGNOSIS — F17210 Nicotine dependence, cigarettes, uncomplicated: Secondary | ICD-10-CM | POA: Insufficient documentation

## 2017-11-24 DIAGNOSIS — M5440 Lumbago with sciatica, unspecified side: Secondary | ICD-10-CM | POA: Insufficient documentation

## 2017-11-24 MED ORDER — CYCLOBENZAPRINE HCL 10 MG PO TABS
10.00 mg | ORAL_TABLET | Freq: Two times a day (BID) | ORAL | 0 refills | Status: AC | PRN
Start: 2017-11-24 — End: 2017-12-09

## 2017-11-24 MED ORDER — LIDOCAINE 5 % EX PTCH
1.00 | MEDICATED_PATCH | CUTANEOUS | 0 refills | Status: AC
Start: 2017-11-24 — End: ?

## 2017-11-24 MED ORDER — IBUPROFEN 600 MG PO TABS
600.00 mg | ORAL_TABLET | Freq: Once | ORAL | Status: AC
Start: 2017-11-24 — End: 2017-11-24
  Administered 2017-11-24: 15:00:00 600 mg via ORAL
  Filled 2017-11-24: qty 1

## 2017-11-24 MED ORDER — NAPROXEN 250 MG PO TABS
250.00 mg | ORAL_TABLET | Freq: Two times a day (BID) | ORAL | 0 refills | Status: AC
Start: 2017-11-24 — End: ?

## 2017-11-24 NOTE — ED Provider Notes (Signed)
EMERGENCY DEPARTMENT HISTORY AND PHYSICAL EXAM     Physician/Midlevel provider first contact with patient: 11/24/17 1512         Date: 11/24/2017  Patient Name: Phillip Galvan    History of Presenting Illness     History Provided By: pt     Chief Complaint   Patient presents with   . Back Pain     Onset:   Timing:   Location:   Quality:   Severity:   Modifying Factors:   Associated Symptoms:     Additional History: Phillip Galvan is a 37 y.o. male p/w low back pain x 3 days. Pain is felt in the entire lumbar region, comes and goes in spasms, occasionally radiates down to his buttocks, leg, and occasionally his rectum. Pt reports a h/o chronic low back pain d/t injury years ago, though he has not had issues for a long time. This pain is different b/c it radiates to his rectum. Denies F/C, loss of bladder or bowel function, saddle anesthesia, numbness, tingling, weakness, IVDU, change in BMs, blood in stool, abdominal pain. Pt concerned an STI could be causing these sxs. Denies dysuria, urethral discharge, testicular pain. 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 No meds taken at home. Drove to ED.     PCP: No primary care provider on file.      No current facility-administered medications for this encounter.      No current outpatient prescriptions on file.       Past History     Past Medical History:  Past Medical History:   Diagnosis Date   . Back pain        Past Surgical History:  History reviewed. No pertinent surgical history.    Family History:  No family history on file.    Social History:  Social History   Substance Use Topics   . Smoking status: Current Every Day Smoker     Packs/day: 0.50     Types: Cigarettes   . Smokeless tobacco: Never Used   . Alcohol use Yes       Allergies:  No Known Allergies    Review of Systems     Review of Systems   Constitutional: Negative for chills and fever.   Eyes: Negative for redness.   Respiratory: Negative for shortness  of breath and stridor.    Cardiovascular: Negative for chest pain.   Gastrointestinal: Negative for abdominal pain, blood in stool, constipation, diarrhea, nausea and vomiting.   Genitourinary: Negative for dysuria, flank pain, frequency, hematuria and urgency.   Musculoskeletal: Positive for back pain.   Skin: Negative for rash.   Neurological: Negative for tingling, sensory change and focal weakness.        Radicular pain   Endo/Heme/Allergies: Does not bruise/bleed easily.   Psychiatric/Behavioral: Negative for substance abuse.       Physical Exam     Vitals:    11/24/17 1441   BP: 151/82   Pulse: 79   Resp: 16   Temp: 98.2 F (36.8 C)   SpO2: 98%   Weight: 83.9 kg   Height: 5\' 11"  (1.803 m)       Physical Exam   Constitutional: He is oriented to person, place, and time. He appears well-developed and well-nourished. No distress.   HENT:   Head: Normocephalic and atraumatic.   Mouth/Throat: Oropharynx is clear and moist.   Eyes: Pupils are equal, round,  and reactive to light. Conjunctivae and EOM are normal.   Neck: Normal range of motion. Neck supple.   Cardiovascular: Normal rate and regular rhythm.    Pulmonary/Chest: Effort normal and breath sounds normal.   Abdominal: Soft. There is no tenderness. There is no guarding.   Genitourinary: Rectum normal, testes normal and penis normal. Rectal exam shows anal tone normal. Cremasteric reflex is present. Circumcised.   Genitourinary Comments: Jola Babinski, RN, as chaperone.    Musculoskeletal:        Back:    Subjective pain reported in lumbar back diffusely, no tenderness to palpation.   5/5 strength and sensation b/l LEs.  Normal gait.    Neurological: He is alert and oriented to person, place, and time.   Skin: Skin is warm and dry.   Psychiatric: He has a normal mood and affect. His behavior is normal.   Nursing note and vitals reviewed.        Diagnostic Study Results     Labs -     Results     Procedure Component Value Units Date/Time    Chlamydia/GC PCR  [[id number]] Collected:  11/24/17 1609     Updated:  11/24/17 1611          Radiologic Studies -   Radiology Results (24 Hour)     ** No results found for the last 24 hours. **      .      Medical Decision Making   I am the first provider for this patient.    I reviewed the vital signs, available nursing notes, past medical history, past surgical history, family history and social history.    Vital Signs-Reviewed the patient's vital signs.     Vitals:    11/24/17 1441   BP: 151/82   Pulse: 79   Resp: 16   Temp: 98.2 F (36.8 C)   SpO2: 98%   Weight: 83.9 kg   Height: 5\' 11"  (1.803 m)       Pulse Oximetry Analysis - Normal 98% on RA    Clinical Course/Medical Decision Making:     16M with 3 days of atraumatic low back pain. Pt concerned b/c pain occasionally radiates to his buttocks and rectum. In ED, well appearing, exam unremarkable. No neuro deficits. Nl rectal exam. GC/CT pending. Advised on NSAIDs, prn flexeril, heat. F/u with PCP. ED return precautions given.       Diagnosis     Clinical Impression:   1. Acute low back pain with sciatica, sciatica laterality unspecified, unspecified back pain laterality        _______________________________    Attestations:  Thea Gist, PA-C, am the primary clinician of record.    Signed by: Sondra Barges, PA-C    _______________________________       Orlene Erm, PA  11/24/17 1617       Celso Amy, MD  11/26/17 1550

## 2017-11-24 NOTE — ED Triage Notes (Signed)
Pt states he has lower back pain. He states he had an injury many years ago. He states he went through a year of treatment and was free of back a pain. Pt state Friday pain began and says "I dont really think so " when asked if the pain is similar to previous pain. Pt states he has been using muscle cream, and it helps go to sleep.

## 2017-11-24 NOTE — Discharge Instructions (Signed)
Back Pain, Lumbar NOS    You have been seen for low back pain.     This area is also called the lumbar spine.    Pain in the low back is very a common problem. This pain is usually due to overuse of the muscles, tension or a strain of the muscles. There can also be damage to the discs that cushion the bones in the spine. This can cause irritation of the nerves that exit the spinal canal in the low back.    Your doctor did not find any pain over the bones in your back (even though you might have pain in the back muscles). This means it is very unlikely that you have a broken bone in your back. Your doctor did not think it was necessary to take an x-ray.    The doctor still does not know the exact cause of your pain. Your problem does not seem to be from a dangerous cause. It is OK for you to go home today.    Some things you can try to help your back feel better are:   Apply a warm damp washcloth to the back for 20 minutes at a time, at least 4 times per day. This will reduce your pain. Massaging your back might also help.   Have someone massage the sore parts of your back.   Don't do any heavy lifting or bending. You can go back to normal daily activities if they don't make the pain worse.   Use the over-the-counter anti-inflammatory medication ibuprofen (also known as Advil or Motrin) as directed on the package to help with pain and inflammation.    It is normal for the pain to last for the next few days.     Call your doctor or go to the nearest Emergency Department if you your pain does not improve or your pain is bad enough to seriously limit your normal activities.    YOU SHOULD SEEK MEDICAL ATTENTION IMMEDIATELY, EITHER HERE OR AT THE NEAREST EMERGENCY DEPARTMENT, IF ANY OF THE FOLLOWING OCCURS:   You think the pain is coming from somewhere other than your back. This can include pelvic pain. This can be from infections in the pelvis or lower belly.   You have abdominal (belly) pain that goes  through to your back.   Your legs tingle or get numb (lose feeling).   Your legs are weak.   You have fever (temperature higher than 100.4F / 38C) along with back pain.   Your back pain is getting worse.   You lose control of your bladder or bowels. If this were to happen, it may cause you to wet or soil yourself.   You have problems urinating (peeing).   Your symptoms get worse or you have new symptoms or concerns.    If you can't follow up with your doctor, or if at any time you feel you need to be rechecked or seen again, come back here or go to the nearest emergency department.

## 2017-11-26 LAB — URINE CHLAMYDIA/NEISSERIA BY PCR
Chlamydia DNA by PCR: NEGATIVE
Neisseria gonorrhoeae by PCR: NEGATIVE

## 2018-07-05 ENCOUNTER — Emergency Department (HOSPITAL_COMMUNITY)
Admission: EM | Admit: 2018-07-05 | Discharge: 2018-07-05 | Disposition: A | Discharge status: Home / Self Care | Payer: 59 | Attending: Emergency Medicine | Admitting: Emergency Medicine

## 2018-07-05 ENCOUNTER — Other Ambulatory Visit: Payer: Self-pay

## 2018-07-05 ENCOUNTER — Encounter (HOSPITAL_COMMUNITY): Payer: Self-pay | Admitting: Obstetrics and Gynecology

## 2018-07-05 DIAGNOSIS — F17210 Nicotine dependence, cigarettes, uncomplicated: Secondary | ICD-10-CM | POA: Insufficient documentation

## 2018-07-05 DIAGNOSIS — T23261A Burn of second degree of back of right hand, initial encounter: Secondary | ICD-10-CM | POA: Insufficient documentation

## 2018-07-05 DIAGNOSIS — Y92007 Garden or yard of unspecified non-institutional (private) residence as the place of occurrence of the external cause: Secondary | ICD-10-CM | POA: Insufficient documentation

## 2018-07-05 DIAGNOSIS — Y999 Unspecified external cause status: Secondary | ICD-10-CM | POA: Insufficient documentation

## 2018-07-05 DIAGNOSIS — X102XXA Contact with fats and cooking oils, initial encounter: Secondary | ICD-10-CM | POA: Insufficient documentation

## 2018-07-05 DIAGNOSIS — Y93G1 Activity, food preparation and clean up: Secondary | ICD-10-CM | POA: Insufficient documentation
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
## 2018-12-06 ENCOUNTER — Encounter (HOSPITAL_COMMUNITY): Payer: Self-pay

## 2018-12-06 ENCOUNTER — Emergency Department (HOSPITAL_COMMUNITY)
Admission: EM | Admit: 2018-12-06 | Discharge: 2018-12-06 | Disposition: A | Discharge status: Home / Self Care | Payer: No Typology Code available for payment source | Attending: Emergency Medicine | Admitting: Emergency Medicine

## 2018-12-06 ENCOUNTER — Other Ambulatory Visit: Payer: Self-pay

## 2018-12-06 DIAGNOSIS — Z79899 Other long term (current) drug therapy: Secondary | ICD-10-CM | POA: Diagnosis not present

## 2018-12-06 DIAGNOSIS — S3992XA Unspecified injury of lower back, initial encounter: Secondary | ICD-10-CM | POA: Diagnosis present

## 2018-12-06 DIAGNOSIS — Y92410 Unspecified street and highway as the place of occurrence of the external cause: Secondary | ICD-10-CM | POA: Insufficient documentation

## 2018-12-06 DIAGNOSIS — S39012A Strain of muscle, fascia and tendon of lower back, initial encounter: Secondary | ICD-10-CM | POA: Insufficient documentation

## 2018-12-06 DIAGNOSIS — Y999 Unspecified external cause status: Secondary | ICD-10-CM | POA: Diagnosis not present

## 2018-12-06 DIAGNOSIS — F17210 Nicotine dependence, cigarettes, uncomplicated: Secondary | ICD-10-CM | POA: Insufficient documentation

## 2018-12-06 DIAGNOSIS — Y9389 Activity, other specified: Secondary | ICD-10-CM | POA: Diagnosis not present

## 2018-12-06 MED ORDER — METHOCARBAMOL 500 MG PO TABS: 500.0000 mg | ORAL_TABLET | Freq: Two times a day (BID) | ORAL | 0 refills | Status: DC

## 2018-12-06 NOTE — Discharge Instructions (Signed)
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
## 2018-12-08 IMAGING — CT CT PELVIS W/ CM
2 of 3 series · 17 of 46 positions shown, 19 images · IV contrast (iopamidol)
Comparison: None.

CLINICAL DATA: Fall.  Pelvic pain.

EXAM:
CT PELVIS WITH CONTRAST
TECHNIQUE: Multidetector CT imaging of the pelvis was performed using the
standard protocol following the bolus administration of intravenous
contrast.
CONTRAST:  100mL [id number] IOPAMIDOL ([id number]) INJECTION 61%

[Series 3: axial st · axial · 0.85mm/px · z∈[+1024,+1294]mm · 14 of 157 slices shown, 16 images]
[im 11/157  soft-tissue]
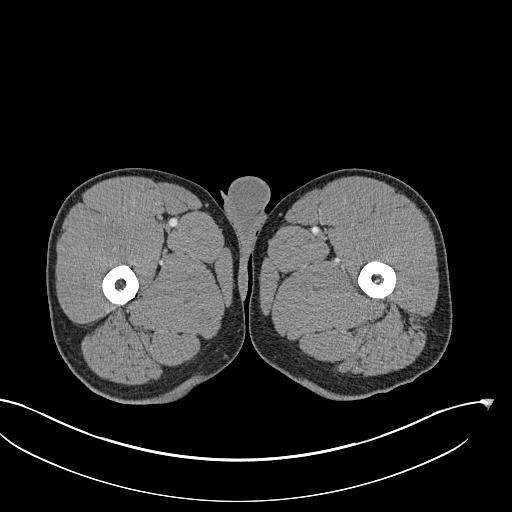
[im 11/157  bone]
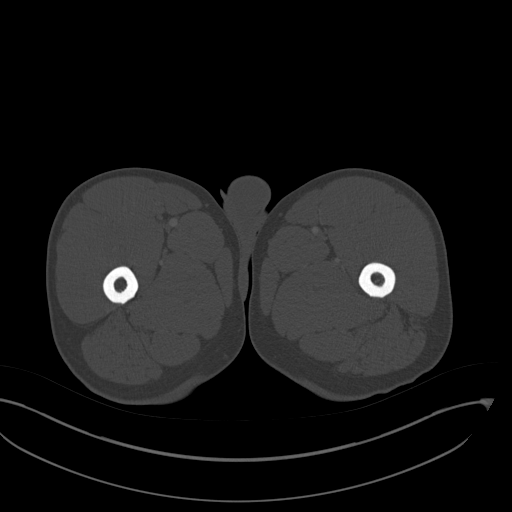
[im 21/157  soft-tissue]
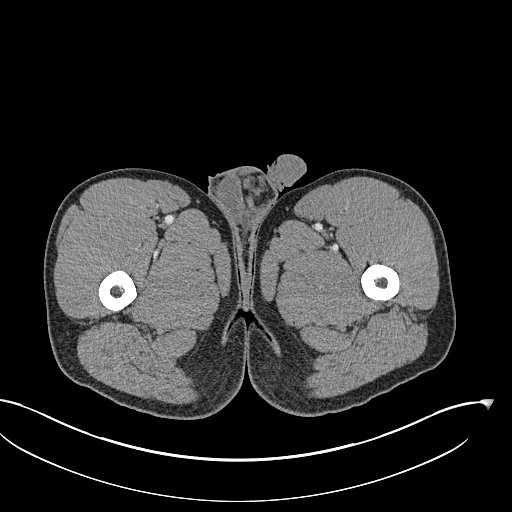
[im 31/157  soft-tissue]
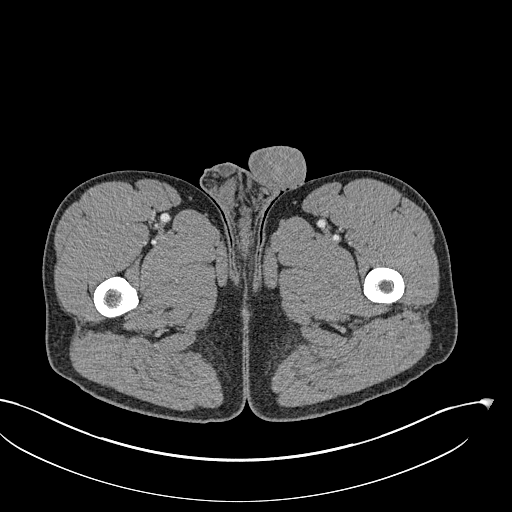
[im 41/157  soft-tissue]
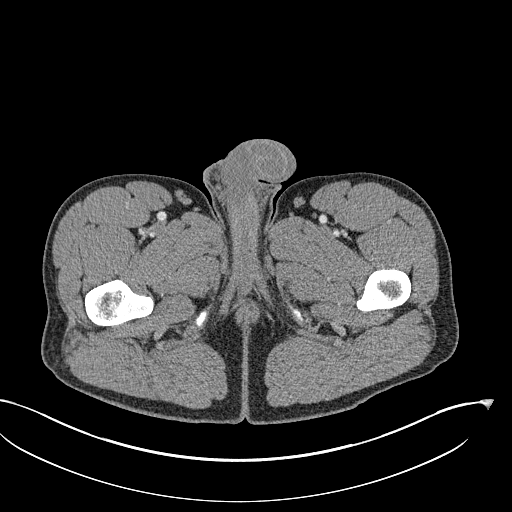
[im 51/157  soft-tissue]
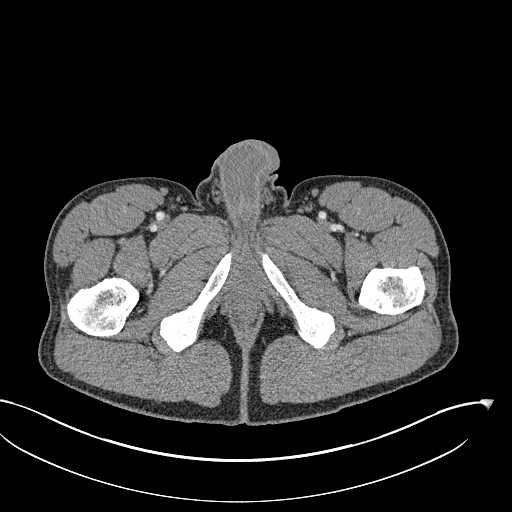
[im 61/157  soft-tissue]
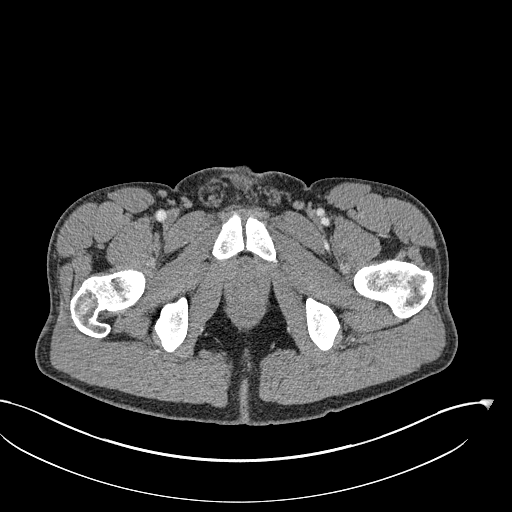
[im 71/157  soft-tissue]
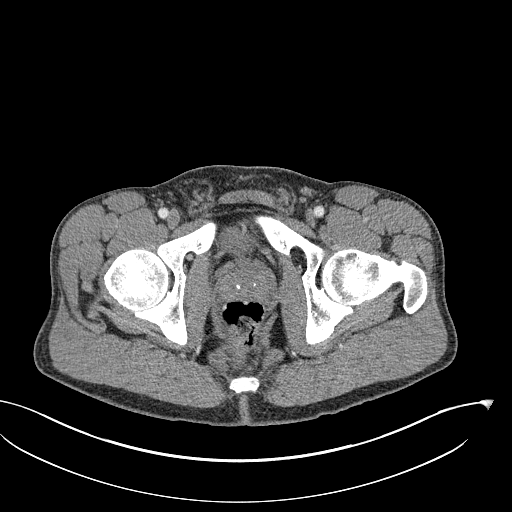
[im 86/157  soft-tissue]
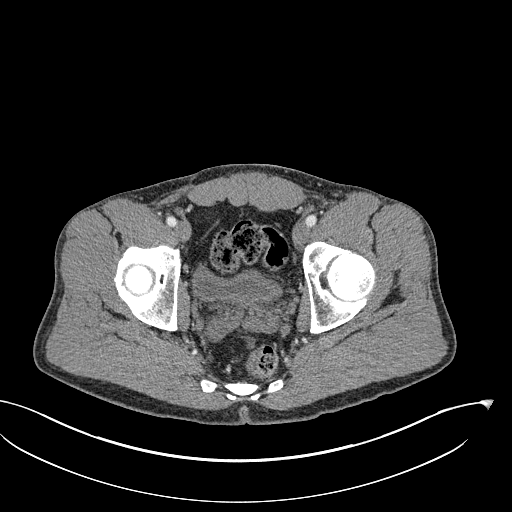
[im 96/157  soft-tissue]
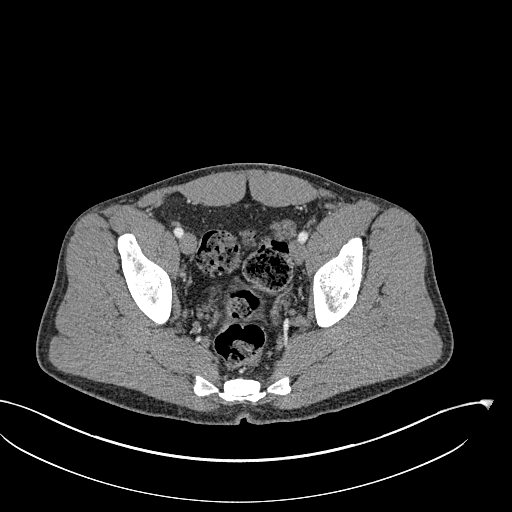
[im 96/157  bone]
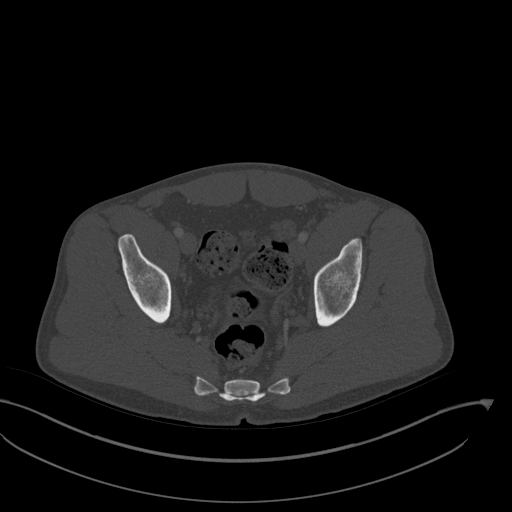
[im 106/157  soft-tissue]
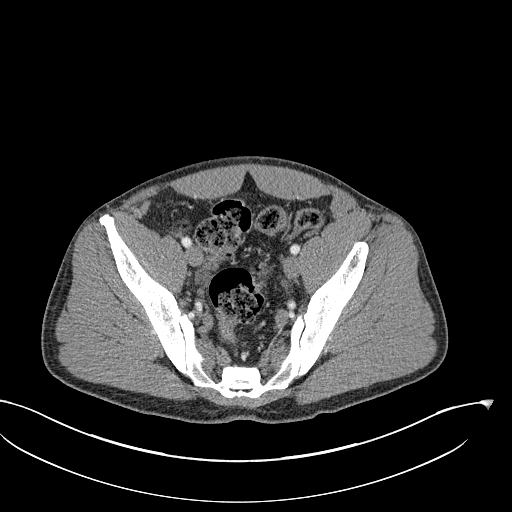
[im 116/157  soft-tissue]
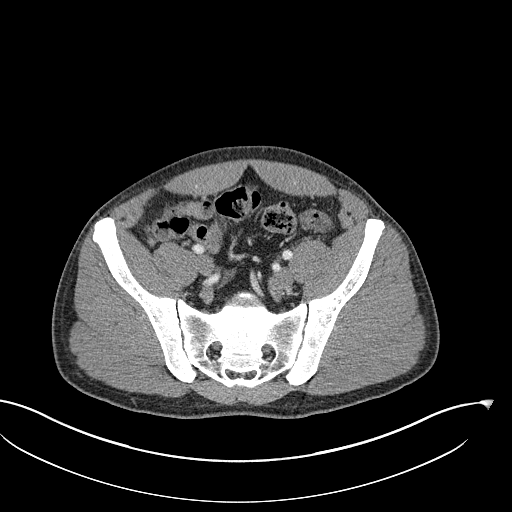
[im 126/157  soft-tissue]
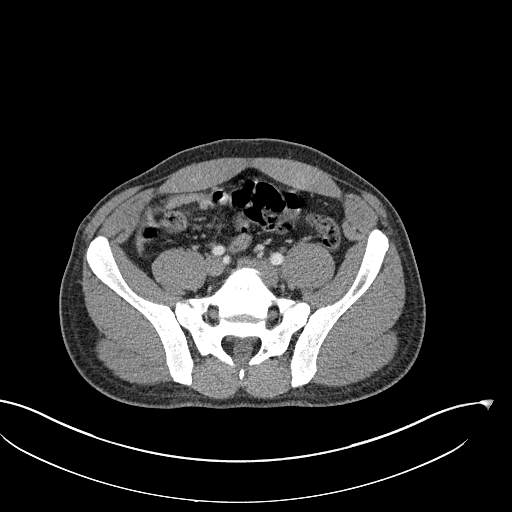
[im 136/157  soft-tissue]
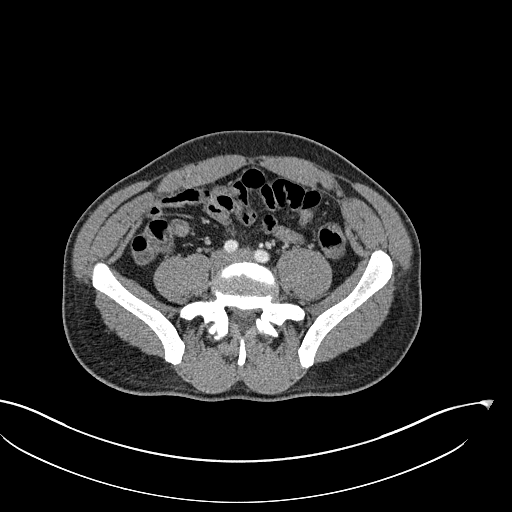
[im 146/157  soft-tissue]
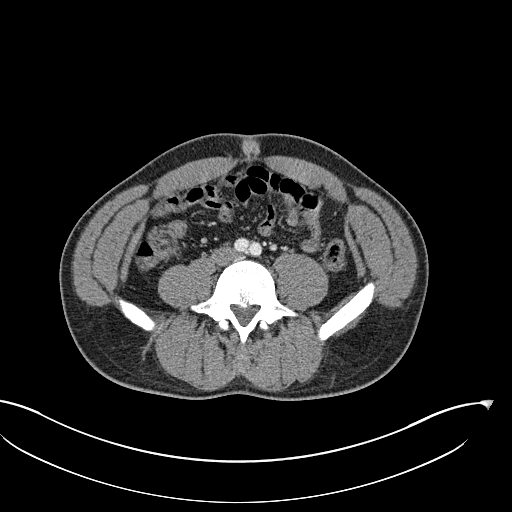

[Series 4: coronal st · coronal · 0.63mm/px · 3 of 125 slices shown]
[im 42/125  soft-tissue]
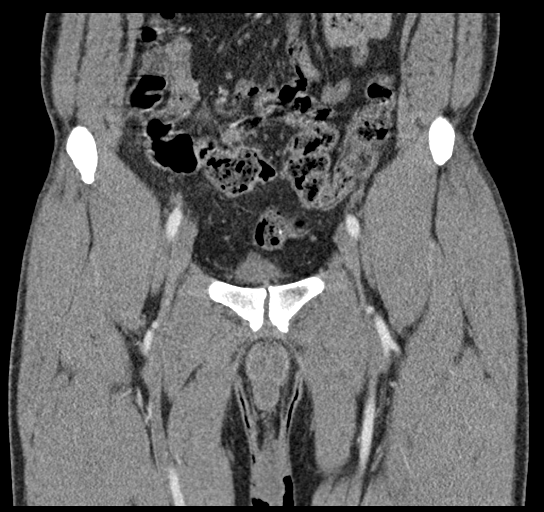
[im 56/125  soft-tissue]
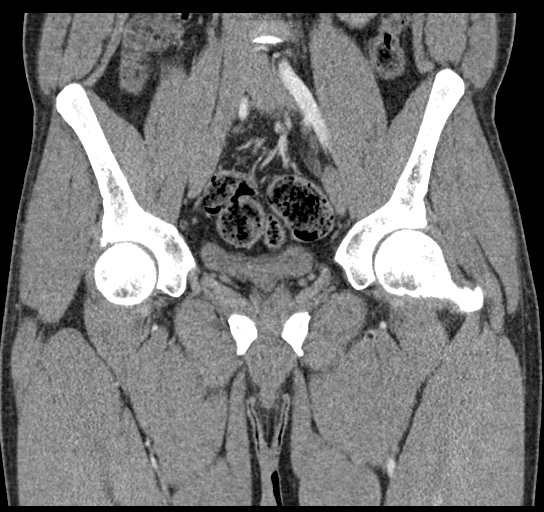
[im 69/125  soft-tissue]
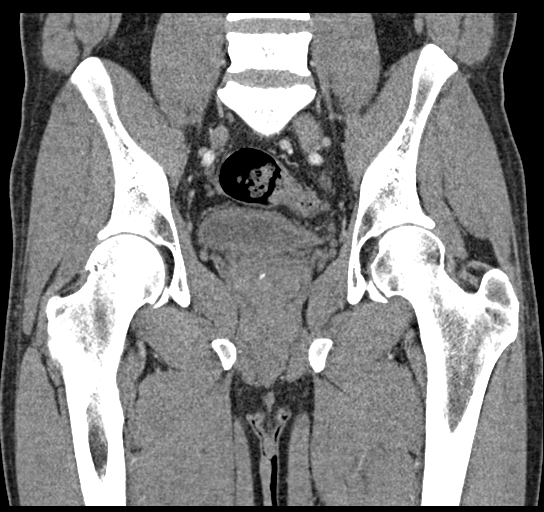

[17 of 46 positions shown; findings below may reference images not displayed]

FINDINGS: Urinary Tract: Ureters are decompressed as is the urinary bladder.
Grossly unremarkable.

Bowel: Appendix normal. Visualized large and small bowel
unremarkable. No obstruction.

Vascular/Lymphatic: No aneurysm or adenopathy.

Reproductive:  No visible focal abnormality.

Other:  No free fluid or free air.

Musculoskeletal: No bony abnormality.  No fracture.
IMPRESSION: No acute findings in the pelvis.

## 2019-02-06 IMAGING — DX DG CHEST 2V
2 series · 2 of 2 positions shown · non-contrast
Comparison: 10/28/2012

CLINICAL DATA: Cough, chills, nausea vomiting and diarrhea
beginning a few days ago.

EXAM:
CHEST  2 VIEW

[chest pa]
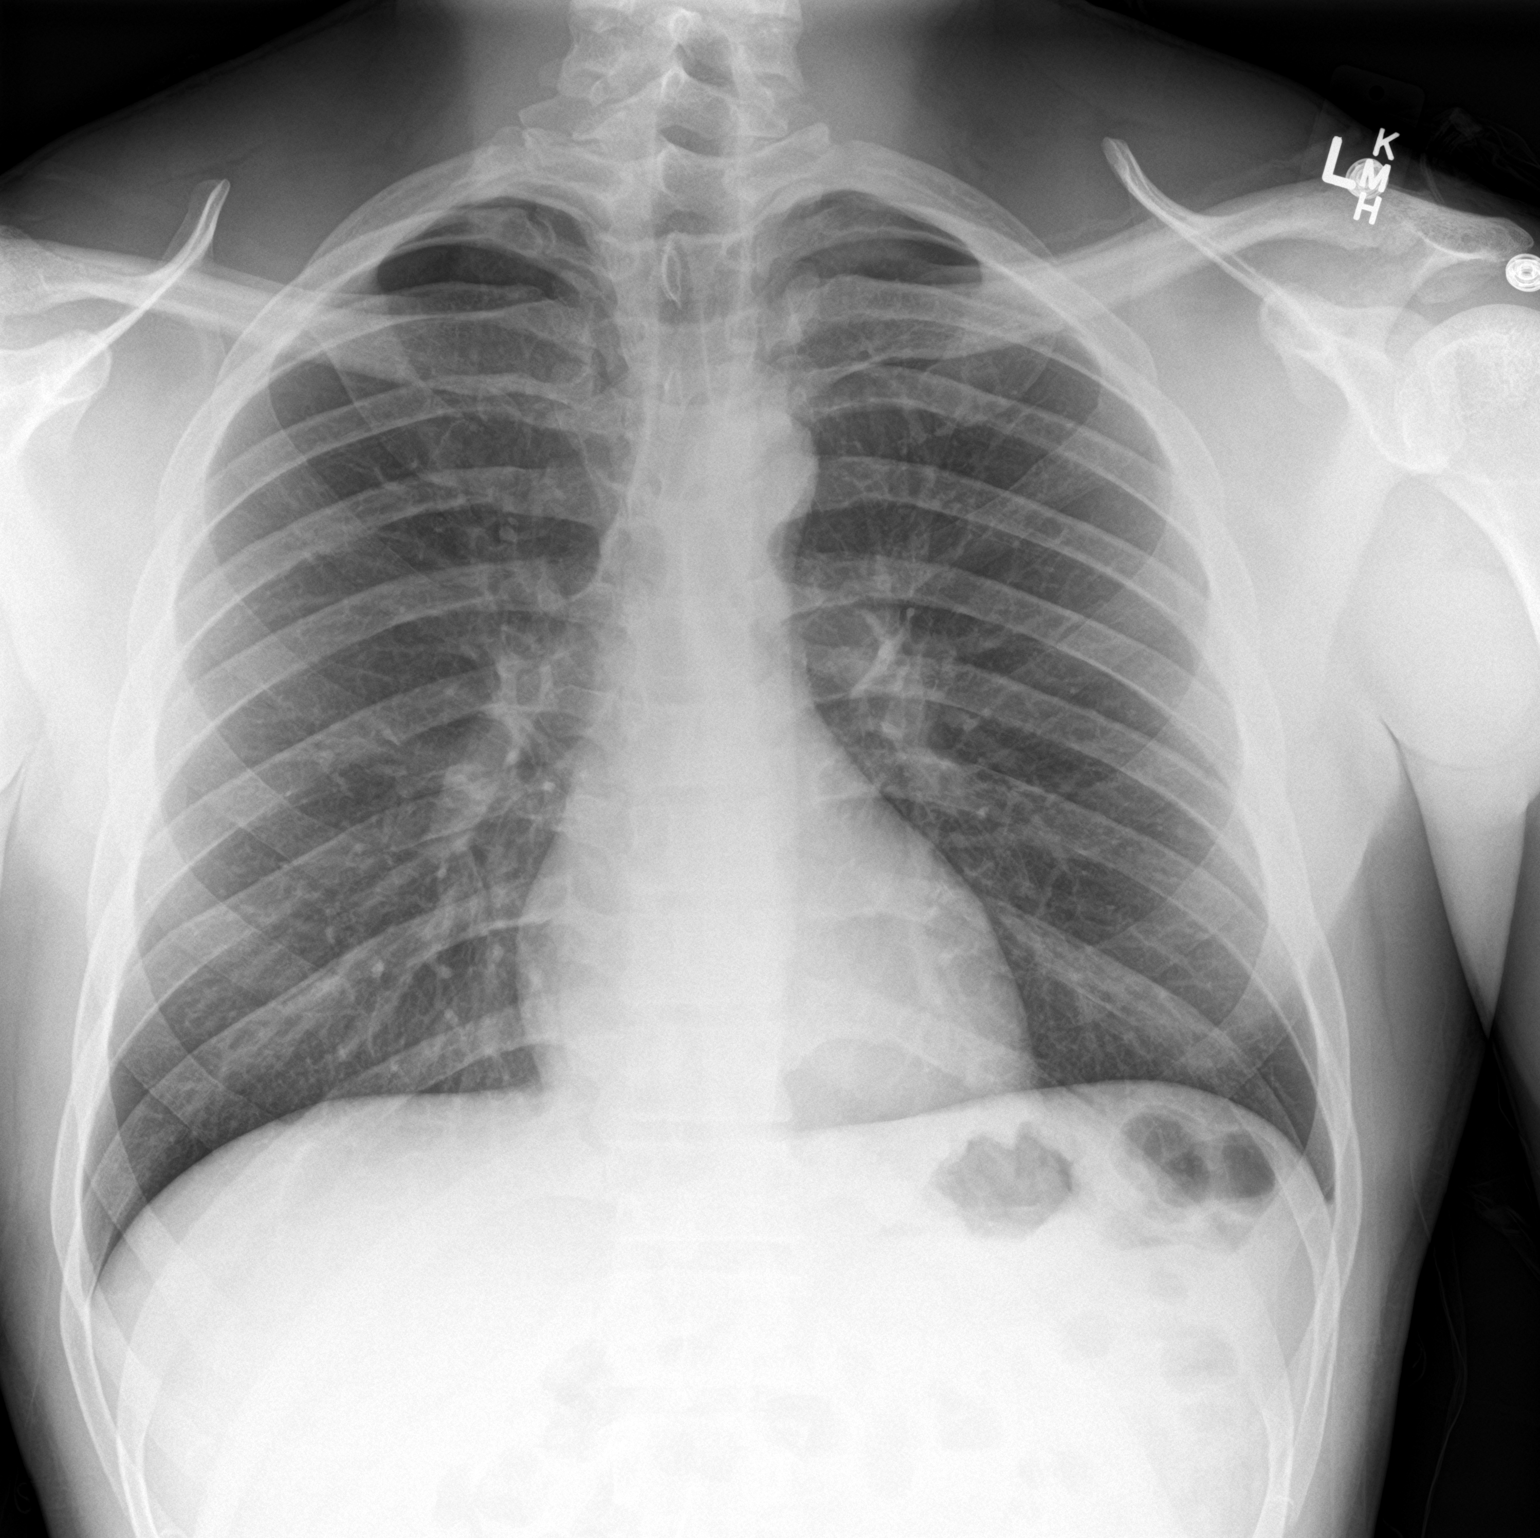

[chest lat]
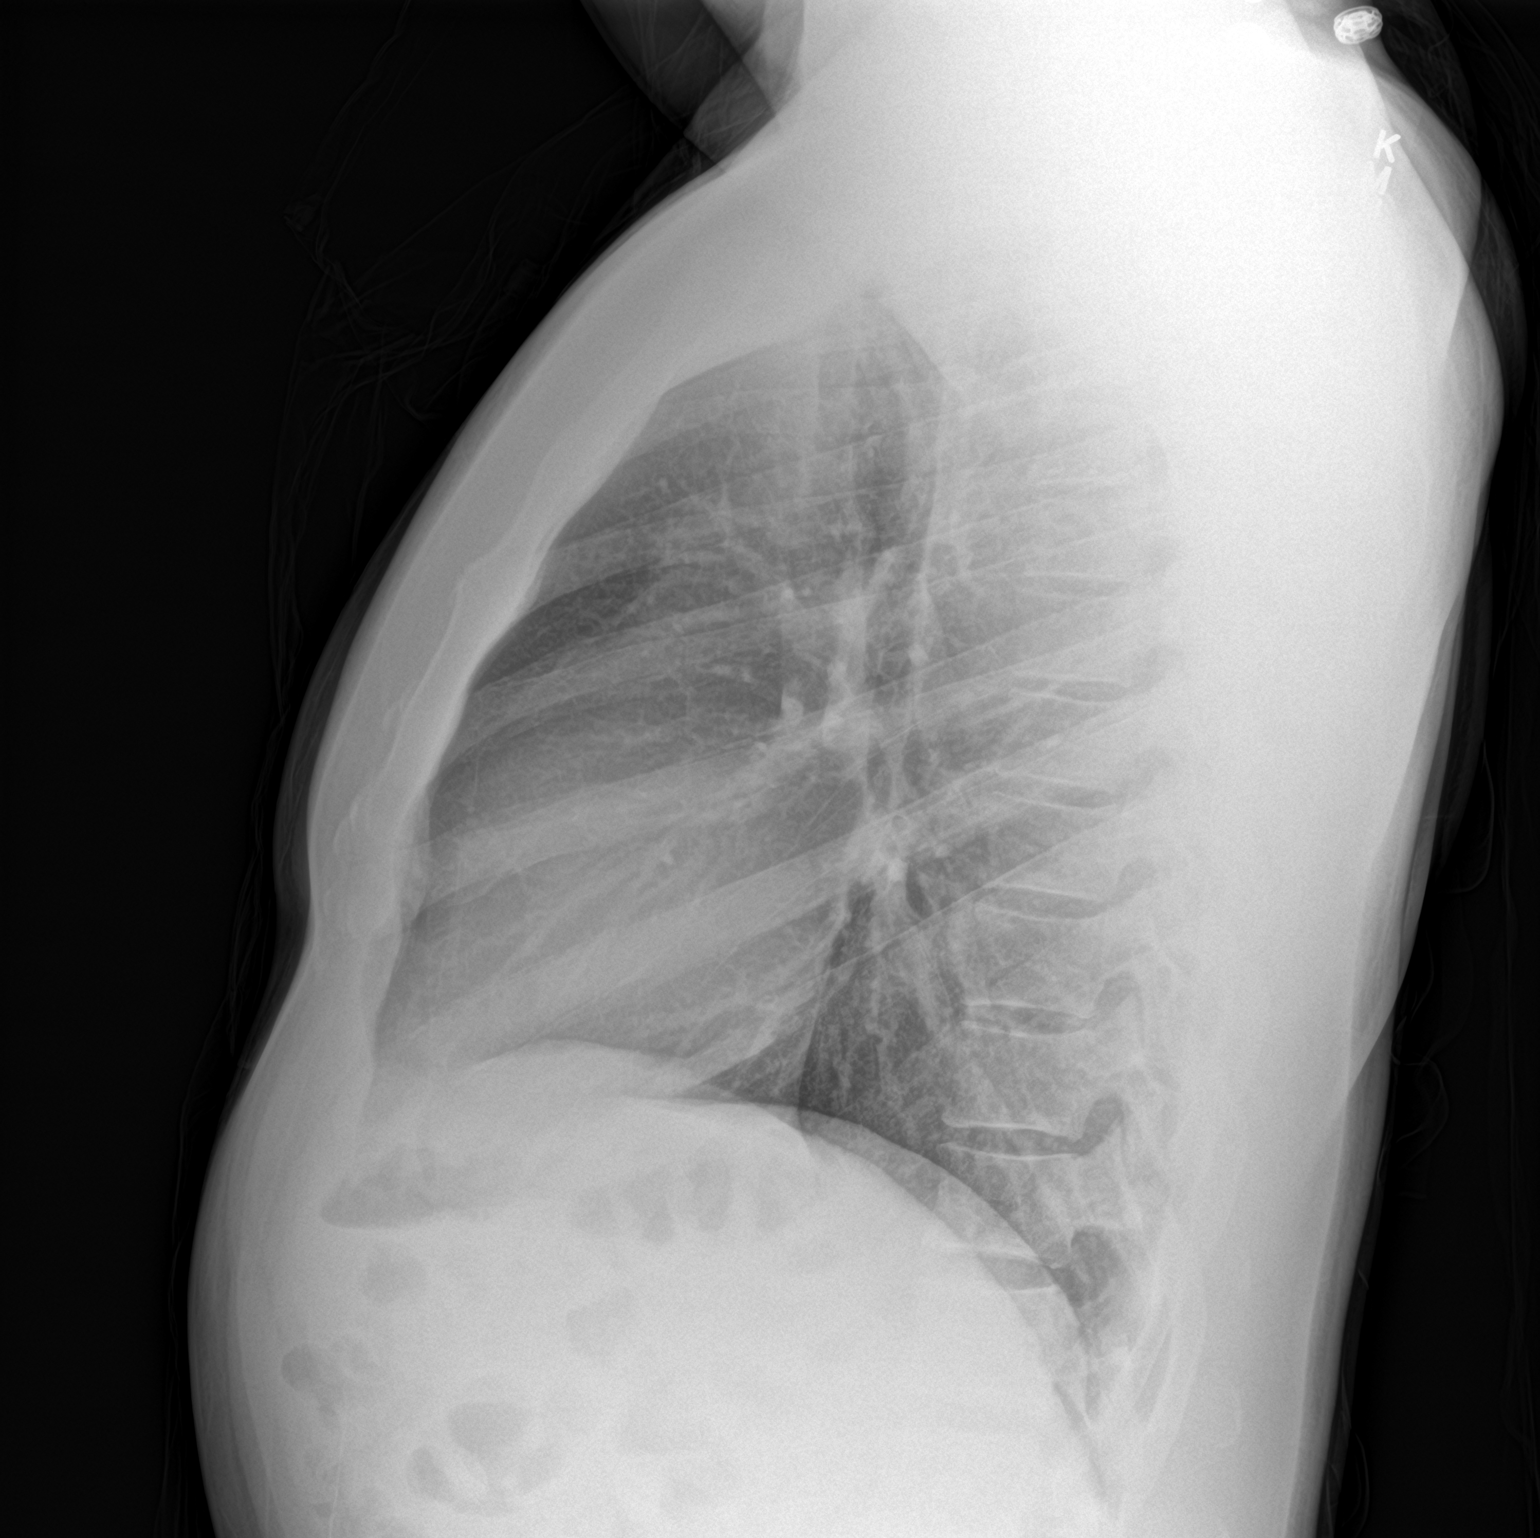

[2 of 2 positions shown; findings below may reference images not displayed]

FINDINGS: Heart size is normal. Mediastinal shadows are normal. The lungs are
clear. No bronchial thickening. No infiltrate, mass, effusion or
collapse. Pulmonary vascularity is normal. No bony abnormality.
IMPRESSION: Normal chest

## 2019-02-19 ENCOUNTER — Other Ambulatory Visit: Payer: Self-pay

## 2019-02-19 ENCOUNTER — Emergency Department (HOSPITAL_COMMUNITY)
Admission: EM | Admit: 2019-02-19 | Discharge: 2019-02-20 | Disposition: A | Discharge status: Home / Self Care | Payer: Self-pay | Attending: Emergency Medicine | Admitting: Emergency Medicine

## 2019-02-19 ENCOUNTER — Encounter (HOSPITAL_COMMUNITY): Payer: Self-pay | Admitting: Emergency Medicine

## 2019-02-19 DIAGNOSIS — J029 Acute pharyngitis, unspecified: Secondary | ICD-10-CM | POA: Insufficient documentation

## 2019-02-19 DIAGNOSIS — Z20828 Contact with and (suspected) exposure to other viral communicable diseases: Secondary | ICD-10-CM | POA: Insufficient documentation

## 2019-02-19 DIAGNOSIS — F17210 Nicotine dependence, cigarettes, uncomplicated: Secondary | ICD-10-CM | POA: Insufficient documentation

## 2019-02-19 LAB — GROUP A STREP BY PCR: Group A Strep by PCR: NOT DETECTED

## 2019-02-19 LAB — POC SARS CORONAVIRUS 2 AG -  ED: SARS Coronavirus 2 Ag: NEGATIVE

## 2019-02-19 MED ORDER — ACETAMINOPHEN 325 MG PO TABS
650.0000 mg | ORAL_TABLET | Freq: Once | ORAL | Status: AC | PRN
  Administered 2019-02-19: 650 mg via ORAL
  Filled 2019-02-19: qty 2

## 2019-02-19 NOTE — ED Triage Notes (Signed)
Patient states that he has a sore throat that started last night. Patient states it is getting worse.

## 2019-02-19 NOTE — Discharge Instructions (Addendum)
Please read instructions below.  You can take tylenol or ibuprofen as needed for sore throat or fever.  Drink plenty of water.  Use saline nasal spray for congestion. Your Covid test should result within 6 to 24 hours of testing.  You will receive a call from the hospital if your test result is positive.  It is important that you self isolate at home until you know your result.  Given that you have a newborn at home, please distance yourself as much as possible, wear a mask when around your newborn and wash your hands frequently.  If your test is negative, suspect you have a viral pharyngitis which you can treat symptomatically.  If your test is positive, you will be instructed of further isolation precautions when you receive a call of your result. Follow up with your primary care provider.  Return to the ER for inability to swallow liquids, difficulty breathing, or new or worsening symptoms.      Person Under Monitoring Name: Anthony Goodman  Location: [place] [address]   Infection Prevention Recommendations for Individuals Confirmed to have, or Being Evaluated for, 2019 Novel Coronavirus (COVID-19) Infection Who Receive Care at Home  Individuals who are confirmed to have, or are being evaluated for, COVID-19 should follow the prevention steps below until a healthcare provider or local or state health department says they can return to normal activities.  Stay home except to get medical care You should restrict activities outside your home, except for getting medical care. Do not go to work, school, or public areas, and do not use public transportation or taxis.  Call ahead before visiting your doctor Before your medical appointment, call the healthcare provider and tell them that you have, or are being evaluated for, COVID-19 infection. This will help the healthcare providers office take steps to keep other people from getting infected. Ask your healthcare  provider to call the local or state health department.  Monitor your symptoms Seek prompt medical attention if your illness is worsening (e.g., difficulty breathing). Before going to your medical appointment, call the healthcare provider and tell them that you have, or are being evaluated for, COVID-19 infection. Ask your healthcare provider to call the local or state health department.  Wear a facemask You should wear a facemask that covers your nose and mouth when you are in the same room with other people and when you visit a healthcare provider. People who live with or visit you should also wear a facemask while they are in the same room with you.  Separate yourself from other people in your home As much as possible, you should stay in a different room from other people in your home. Also, you should use a separate bathroom, if available.  Avoid sharing household items You should not share dishes, drinking glasses, cups, eating utensils, towels, bedding, or other items with other people in your home. After using these items, you should wash them thoroughly with soap and water.  Cover your coughs and sneezes Cover your mouth and nose with a tissue when you cough or sneeze, or you can cough or sneeze into your sleeve. Throw used tissues in a lined trash can, and immediately wash your hands with soap and water for at least 20 seconds or use an alcohol-based hand rub.  Wash your [facility] your hands often and thoroughly with soap and water for at least 20 seconds. You can use an alcohol-based hand sanitizer if soap  and water are not available and if your hands are not visibly dirty. Avoid touching your eyes, nose, and mouth with unwashed hands.   Prevention Steps for Caregivers and Household Members of Individuals Confirmed to have, or Being Evaluated for, COVID-19 Infection Being Cared for in the Home  If you live with, or provide care at home for, a person confirmed to have, or  being evaluated for, COVID-19 infection please follow these guidelines to prevent infection:  Follow healthcare providers instructions Make sure that you understand and can help the patient follow any healthcare provider instructions for all care.  Provide for the patients basic needs You should help the patient with basic needs in the home and provide support for getting groceries, prescriptions, and other personal needs.  Monitor the patients symptoms If they are getting sicker, call his or her medical provider and tell them that the patient has, or is being evaluated for, COVID-19 infection. This will help the healthcare providers office take steps to keep other people from getting infected. Ask the healthcare provider to call the local or state health department.  Limit the number of people who have contact with the patient If possible, have only one caregiver for the patient. Other household members should stay in another home or place of residence. If this is not possible, they should stay in another room, or be separated from the patient as much as possible. Use a separate bathroom, if available. Restrict visitors who do not have an essential need to be in the home.  Keep older adults, very young children, and other sick people away from the patient Keep older adults, very young children, and those who have compromised immune systems or chronic health conditions away from the patient. This includes people with chronic heart, lung, or kidney conditions, diabetes, and cancer.  Ensure good ventilation Make sure that shared spaces in the home have good air flow, such as from an air conditioner or an opened window, weather permitting.  Wash your hands often Wash your hands often and thoroughly with soap and water for at least 20 seconds. You can use an alcohol based hand sanitizer if soap and water are not available and if your hands are not visibly dirty. Avoid touching your  eyes, nose, and mouth with unwashed hands. Use disposable paper towels to dry your hands. If not available, use dedicated cloth towels and replace them when they become wet.  Wear a facemask and gloves Wear a disposable facemask at all times in the room and gloves when you touch or have contact with the patients blood, body fluids, and/or secretions or excretions, such as sweat, saliva, sputum, nasal mucus, vomit, urine, or feces.  Ensure the mask fits over your nose and mouth tightly, and do not touch it during use. Throw out disposable facemasks and gloves after using them. Do not reuse. Wash your hands immediately after removing your facemask and gloves. If your personal clothing becomes contaminated, carefully remove clothing and launder. Wash your hands after handling contaminated clothing. Place all used disposable facemasks, gloves, and other waste in a lined container before disposing them with other household waste. Remove gloves and wash your hands immediately after handling these items.  Do not share dishes, glasses, or other household items with the patient Avoid sharing household items. You should not share dishes, drinking glasses, cups, eating utensils, towels, bedding, or other items with a patient who is confirmed to have, or being evaluated for, COVID-19 infection. After the person uses  these items, you should wash them thoroughly with soap and water.  Wash laundry thoroughly Immediately remove and wash clothes or bedding that have blood, body fluids, and/or secretions or excretions, such as sweat, saliva, sputum, nasal mucus, vomit, urine, or feces, on them. Wear gloves when handling laundry from the patient. Read and follow directions on labels of laundry or clothing items and detergent. In general, wash and dry with the warmest temperatures recommended on the label.  Clean all areas the individual has used often Clean all touchable surfaces, such as counters, tabletops,  doorknobs, bathroom fixtures, toilets, phones, keyboards, tablets, and bedside tables, every day. Also, clean any surfaces that may have blood, body fluids, and/or secretions or excretions on them. Wear gloves when cleaning surfaces the patient has come in contact with. Use a diluted bleach solution (e.g., dilute bleach with 1 part bleach and 10 parts water) or a household disinfectant with a label that says EPA-registered for coronaviruses. To make a bleach solution at home, add 1 tablespoon of bleach to 1 quart (4 cups) of water. For a larger supply, add  cup of bleach to 1 gallon (16 cups) of water. Read labels of cleaning products and follow recommendations provided on product labels. Labels contain instructions for safe and effective use of the cleaning product including precautions you should take when applying the product, such as wearing gloves or eye protection and making sure you have good ventilation during use of the product. Remove gloves and wash hands immediately after cleaning.  Monitor yourself for signs and symptoms of illness Caregivers and household members are considered close contacts, should monitor their health, and will be asked to limit movement outside of the home to the extent possible. Follow the monitoring steps for close contacts listed on the symptom monitoring form.   ? If you have additional questions, contact your local health department or call the epidemiologist on call at [phone] (available 24/7). ? This guidance is subject to change. For the most up-to-date guidance from [facility], please refer to their website: TripMetro.huhttps://www.cdc.gov/coronavirus/2019-ncov/hcp/guidance-prevent-spread.html

## 2019-02-19 NOTE — ED Provider Notes (Signed)
[facility] DEPT Provider Note   CSN: [id number] Arrival date & time: 02/19/19  2104     History   Chief Complaint Chief Complaint  Patient presents with  . Sore Throat    HPI Anthony Goodman is a 38 y.o. male without significant past medical history, presenting to the emergency department gradual onset of sore throat that began last night.  Patient states he is treating his symptoms with DayQuil as well as Chloraseptic spray and warm tea which provided improvement in symptoms, however symptoms returned this afternoon and he noticed a fever at home.  He has some associated chills.  Denies nasal congestion, rhinorrhea, cough, changes in taste or smell, abdominal complaints.  He does report that he has a 64-week-old newborn at home and is concerned about the COVID-19 virus.  His wife is asymptomatic and had a negative test 1 week ago.  Known Covid contacts.     The history is provided by the patient.    History reviewed. No pertinent past medical history.  There are no active problems to display for this patient.   Past Surgical History:  Procedure Laterality Date  . HERNIA REPAIR          Home Medications    Prior to Admission medications   Medication Sig Start Date End Date Taking? Authorizing Provider  cyclobenzaprine (FLEXERIL) 10 MG tablet Take 1 tablet (10 mg total) by mouth 2 (two) times daily as needed for muscle spasms. 06/16/17   Shelda Pal, DO  methocarbamol (ROBAXIN) 500 MG tablet Take 1 tablet (500 mg total) by mouth 2 (two) times daily. 12/06/18   Volanda Napoleon, PA-C  naproxen (NAPROSYN) 500 MG tablet Take 1 tablet (500 mg total) by mouth 2 (two) times daily. 06/16/17   Shelda Pal, DO    Family History Family History  Problem Relation Age of Onset  . Cancer Father     Social History Social History   Tobacco Use  . Smoking status: Current Every Day Smoker    Packs/day: 0.50    Years: 10.00     Pack years: 5.00    Types: Cigarettes  . Smokeless tobacco: Never Used  Substance Use Topics  . Alcohol use: Yes    Alcohol/week: 2.0 standard drinks    Types: 2 Shots of liquor per week  . Drug use: No     Allergies   Patient has no known allergies.   Review of Systems Review of Systems  Constitutional: Positive for chills and fever.  HENT: Positive for sore throat.      Physical Exam Updated Vital Signs BP (!) 146/85 (BP Location: Left Arm)   Pulse 87   Temp 100.3 F (37.9 C) (Oral)   Resp 16   Ht 5\' 11"  (1.803 m)   Wt 89.8 kg   SpO2 96%   BMI 27.62 kg/m   Physical Exam Vitals signs and nursing note reviewed.  Constitutional:      General: He is not in acute distress.    Appearance: He is well-developed. He is not ill-appearing.  HENT:     Head: Normocephalic and atraumatic.     Mouth/Throat:     Tonsils: No tonsillar exudate or tonsillar abscesses.     Comments: Posterior pharyngeal erythema is present, no swelling or exudates. Tolerating secretions, uvula is midline, no trismus Eyes:     Conjunctiva/sclera: Conjunctivae normal.  Neck:     Musculoskeletal: Normal range of motion and neck supple.  Cardiovascular:     Rate and Rhythm: Normal rate.  Pulmonary:     Effort: Pulmonary effort is normal. No respiratory distress.  Lymphadenopathy:     Cervical: Cervical adenopathy present.  Neurological:     Mental Status: He is alert.  Psychiatric:        Mood and Affect: Mood normal.        Behavior: Behavior normal.      ED Treatments / Results  Labs (all labs ordered are listed, but only abnormal results are displayed) Labs Reviewed  GROUP A STREP BY PCR  SARS CORONAVIRUS 2 (TAT 6-24 HRS)  POC SARS CORONAVIRUS 2 AG -  ED    EKG None  Radiology No results found.  Procedures Procedures (including critical care time)  Medications Ordered in ED Medications  acetaminophen (TYLENOL) tablet 650 mg (650 mg Oral Given 02/19/19 2115)      Initial Impression / Assessment and Plan / ED Course  I have reviewed the triage vital signs and the nursing notes.  Pertinent labs & imaging results that were available during my care of the patient were reviewed by me and considered in my medical decision making (see chart for details).    Anthony Goodman was evaluated in Emergency Department on 02/19/2019 for the symptoms described in the history of present illness. He was evaluated in the context of the global COVID-19 pandemic, which necessitated consideration that the patient might be at risk for infection with the SARS-CoV-2 virus that causes COVID-19. Institutional protocols and algorithms that pertain to the evaluation of patients at risk for COVID-19 are in a state of rapid change based on information released by regulatory bodies including the CDC and federal and state organizations. These policies and algorithms were followed during the patient's care in the ED.     Pt presenting with sore throat and fever that began yesterday.  On exam, patient with low-grade fever, no tonsillar exudate, negative strep. Presents with mild cervical lymphadenopathy, & dysphagia; suspect viral pharyngitis.  Presentation non concerning for PTA or infxn spread to soft tissue. No trismus or uvula deviation.  However, given current COVID-19 pandemic, will send Covid test.  Patient has a newborn at home, instructed of distancing as much as possible, hand hygiene and mask wearing at all times.  No abx indicated. DC w symptomatic tx for pain  Pt does not appear dehydrated, but did discuss importance of water rehydration.Specific return precautions discussed. Pt able to drink water in ED without difficulty with intact air way. Recommended PCP follow up.  Discussed results, findings, treatment and follow up. Patient advised of return precautions. Patient verbalized understanding and agreed with plan.  Final Clinical Impressions(s) / ED Diagnoses   Final  diagnoses:  Viral pharyngitis    ED Discharge Orders    None       Anthony Goodman, Anthony N, PA-C 02/19/19 2318    Anthony Loveless, MD 02/19/19 [phone]

## 2019-02-20 LAB — SARS CORONAVIRUS 2 (TAT 6-24 HRS): SARS Coronavirus 2: NEGATIVE

## 2019-03-27 ENCOUNTER — Encounter (HOSPITAL_COMMUNITY): Payer: Self-pay

## 2019-03-27 ENCOUNTER — Other Ambulatory Visit: Payer: Self-pay

## 2019-03-27 ENCOUNTER — Ambulatory Visit (HOSPITAL_COMMUNITY)
Admission: EM | Admit: 2019-03-27 | Discharge: 2019-03-27 | Disposition: A | Discharge status: Home / Self Care | Payer: Self-pay | Attending: Family Medicine | Admitting: Family Medicine

## 2019-03-27 DIAGNOSIS — R0789 Other chest pain: Secondary | ICD-10-CM

## 2019-03-27 MED ORDER — MELOXICAM 15 MG PO TABS: 15.0000 mg | ORAL_TABLET | Freq: Every day | ORAL | 1 refills | Status: DC

## 2019-03-27 MED ORDER — CYCLOBENZAPRINE HCL 10 MG PO TABS: 10.0000 mg | ORAL_TABLET | Freq: Two times a day (BID) | ORAL | 0 refills | Status: DC | PRN

## 2019-03-27 NOTE — ED Triage Notes (Signed)
Patient presents to Urgent Care with complaints of strange muscle spasm/ flutter in his left chest while at work since 2 days ago. Patient reports it went away quickly but then returned yesterday for a few minutes longer. It happened again today for approx 3-4 minutes, states he can feel the soreness when he presses on the area of pain, thinks it is muscular but due to his smoking hx would like to be sure.

## 2019-03-27 NOTE — ED Provider Notes (Signed)
[facility]    CSN: [id number] Arrival date & time: 03/27/19  1411      History   Chief Complaint Chief Complaint  Patient presents with  . Chest Pain    HPI Anthony Goodman is a 39 y.o. male.   HPI  Anthony Goodman presents today with complaint of left sided chest pain and sensation of spasms. Patient works as an Personnel officer and denies any known injury involving the left chest or shoulder. The pain occurs intermittently. Last episode occured last night and awakened him due to the level of pain. Pain occurs upper left chest wall medial to the shoulder. Paiin is reproducible to touch. Pain doesn't radiate down or across the right side of chest.  He has not taken anything today for pain. Patient BP is elevated today and he denies prior diagnosis of hypertension. No known family history of sudden cardiac death in males less than 24 years old. Denies any shortness of breath, diaphoresis, weakness, nausea, or vomiting.  History reviewed. No pertinent past medical history.  There are no problems to display for this patient.   Past Surgical History:  Procedure Laterality Date  . HERNIA REPAIR         Home Medications    Prior to Admission medications   Medication Sig Start Date End Date Taking? Authorizing Provider  cyclobenzaprine (FLEXERIL) 10 MG tablet Take 1 tablet (10 mg total) by mouth 2 (two) times daily as needed for muscle spasms. 06/16/17   Sharlene Dory, DO  methocarbamol (ROBAXIN) 500 MG tablet Take 1 tablet (500 mg total) by mouth 2 (two) times daily. 12/06/18   Maxwell Caul, PA-C  naproxen (NAPROSYN) 500 MG tablet Take 1 tablet (500 mg total) by mouth 2 (two) times daily. 06/16/17   Sharlene Dory, DO    Family History Family History  Problem Relation Age of Onset  . Healthy Mother   . Cancer Father     Social History Social History   Tobacco Use  . Smoking status: Current Every Day Smoker    Packs/day: 0.50    Years: 10.00   Pack years: 5.00    Types: Cigarettes  . Smokeless tobacco: Never Used  Substance Use Topics  . Alcohol use: Yes    Alcohol/week: 2.0 standard drinks    Types: 2 Shots of liquor per week  . Drug use: No     Allergies   Patient has no known allergies.   Review of Systems Review of Systems Pertinent negatives listed in HPI Physical Exam Triage Vital Signs ED Triage Vitals  Enc Vitals Group     BP 03/27/19 1547 (!) 153/89     Pulse Rate 03/27/19 1547 77     Resp 03/27/19 1547 16     Temp 03/27/19 1547 98.6 F (37 C)     Temp Source 03/27/19 1547 Oral     SpO2 03/27/19 1547 98 %     Weight --      Height --      Head Circumference --      Peak Flow --      Pain Score 03/27/19 1545 0     Pain Loc --      Pain Edu? --      Excl. in GC? --    No data found.  Updated Vital Signs BP (!) 153/89 (BP Location: Right Arm)   Pulse 77   Temp 98.6 F (37 C) (Oral)   Resp 16   SpO2  98%   Visual Acuity Right Eye Distance:   Left Eye Distance:   Bilateral Distance:    Right Eye Near:   Left Eye Near:    Bilateral Near:     Physical Exam General appearance: alert, well developed, well nourished, cooperative and in no distress Head: Normocephalic, without obvious abnormality, atraumatic Respiratory: Respirations even and unlabored, normal respiratory rate Chest Wall: Tenderness deep palpation upper left chest wall medial to shoulder Heart: rate and rhythm normal. No gallop or murmurs noted on exam  Abdomen: BS +, no distention, no rebound tenderness, or no mass Extremities: No gross deformities Skin: Skin color, texture, turgor normal. No rashes seen  Psych: Appropriate mood and affect. Neurologic: Alert, oriented to person, place, and time, thought content appropriate.  UC Treatments / Results  Labs (all labs ordered are listed, but only abnormal results are displayed) Labs Reviewed - No data to display  EKG   Radiology No results  found.  Procedures Procedures (including critical care time)  Medications Ordered in UC Medications - No data to display  Initial Impression / Assessment and Plan / UC Course  I have reviewed the triage vital signs and the nursing notes.  Pertinent labs & imaging results that were available during my care of the patient were reviewed by me and considered in my medical decision making (see chart for details).    Chest wall pain, reproducible with palpation and with manipulation of left extremity indicating likely chest wall strain. Patient has not taken any medication for pain. Will trial antiinflammatory to decrease inflammation and Flexeril for pain. Discussed also home monitoring of blood pressure as he has had multiple elevated BP readings over the course of 1 year all involving visit for acute illness, however could indicate underlying hypertensive disease. Provided information to follow-up with and establish with a PCP for a complete physical. Patient verbalized understanding and will return here for follow-up at the ER if symptoms worsen or do not improve. Final Clinical Impressions(s) / UC Diagnoses   Final diagnoses:  Left-sided chest wall pain   Discharge Instructions   None    ED Prescriptions    Medication Sig Dispense Auth. Provider   cyclobenzaprine (FLEXERIL) 10 MG tablet Take 1 tablet (10 mg total) by mouth 2 (two) times daily as needed for muscle spasms. 20 tablet Scot Jun, FNP   meloxicam (MOBIC) 15 MG tablet Take 1 tablet (15 mg total) by mouth daily. 30 tablet Scot Jun, FNP     PDMP not reviewed this encounter.   Scot Jun, FNP 03/28/19 [phone]

## 2020-07-17 ENCOUNTER — Encounter (HOSPITAL_COMMUNITY): Payer: Self-pay

## 2020-07-17 ENCOUNTER — Other Ambulatory Visit: Payer: Self-pay

## 2020-07-17 ENCOUNTER — Ambulatory Visit (HOSPITAL_COMMUNITY)
Admission: EM | Admit: 2020-07-17 | Discharge: 2020-07-17 | Disposition: A | Discharge status: Home / Self Care | Payer: Self-pay | Attending: Emergency Medicine | Admitting: Emergency Medicine

## 2020-07-17 DIAGNOSIS — M5440 Lumbago with sciatica, unspecified side: Secondary | ICD-10-CM

## 2020-07-17 MED ORDER — NAPROXEN 500 MG PO TABS: 500.0000 mg | ORAL_TABLET | Freq: Two times a day (BID) | ORAL | 0 refills | Status: AC

## 2020-07-17 MED ORDER — CYCLOBENZAPRINE HCL 10 MG PO TABS: 10.0000 mg | ORAL_TABLET | Freq: Two times a day (BID) | ORAL | 0 refills | Status: AC | PRN

## 2020-07-17 NOTE — Discharge Instructions (Signed)
Take naproxen twice a day for the next 5 days then as needed  Can use Flexeril at bedtime as needed for additional comfort. Be mindful this may make you drowsy. May also take half a pill to decrease drowsy effect  As pain decreases gently stretch back and legs   Can follow up with urgent care or orthopedics if symptoms persist or worsen after completing recommended course of medication

## 2020-07-17 NOTE — ED Provider Notes (Signed)
[facility]    CSN: [id number] Arrival date & time: 07/17/20  1143      History   Chief Complaint Chief Complaint  Patient presents with  . Hip Pain    HPI Anthony Goodman is a 40 y.o. male.   Patient presents with left-sided back pain for years progressively has gotten worse over the last month.  Pain radiates into left hip and down into the left leg causing numbness and tingling.  Feels like leg is asleep.  Worsen when sitting.  Range of motion intact.  Has been using deep tissue massages acupuncture with some relief.  Works in [employer] and has taken some time off plans to restart work on May 2nd.   History reviewed. No pertinent past medical history.  There are no problems to display for this patient.   Past Surgical History:  Procedure Laterality Date  . HERNIA REPAIR         Home Medications    Prior to Admission medications   Medication Sig Start Date End Date Taking? Authorizing Provider  naproxen (NAPROSYN) 500 MG tablet Take 1 tablet (500 mg total) by mouth 2 (two) times daily. 07/17/20  Yes Dajion Bickford, Elita Boone, NP  cyclobenzaprine (FLEXERIL) 10 MG tablet Take 1 tablet (10 mg total) by mouth 2 (two) times daily as needed for muscle spasms. 07/17/20   Haru Anspaugh, Elita Boone, NP  methocarbamol (ROBAXIN) 500 MG tablet Take 1 tablet (500 mg total) by mouth 2 (two) times daily. 12/06/18   Maxwell Caul, PA-C    Family History Family History  Problem Relation Age of Onset  . Healthy Mother   . Cancer Father     Social History Social History   Tobacco Use  . Smoking status: Current Every Day Smoker    Packs/day: 0.50    Years: 10.00    Pack years: 5.00    Types: Cigarettes  . Smokeless tobacco: Never Used  Vaping Use  . Vaping Use: Never used  Substance Use Topics  . Alcohol use: Yes    Alcohol/week: 2.0 standard drinks    Types: 2 Shots of liquor per week  . Drug use: No     Allergies   Patient has no known allergies.   Review of  Systems Review of Systems  Defer to HPI   Physical Exam Triage Vital Signs ED Triage Vitals  Enc Vitals Group     BP 07/17/20 1212 (!) 133/92     Pulse Rate 07/17/20 1212 73     Resp 07/17/20 1212 18     Temp 07/17/20 1212 98.3 F (36.8 C)     Temp Source 07/17/20 1212 Oral     SpO2 07/17/20 1212 99 %     Weight --      Height --      Head Circumference --      Peak Flow --      Pain Score 07/17/20 1211 5     Pain Loc --      Pain Edu? --      Excl. in GC? --    No data found.  Updated Vital Signs BP (!) 133/92 (BP Location: Right Arm)   Pulse 73   Temp 98.3 F (36.8 C) (Oral)   Resp 18   SpO2 99%   Visual Acuity Right Eye Distance:   Left Eye Distance:   Bilateral Distance:    Right Eye Near:   Left Eye Near:    Bilateral Near:  Physical Exam Constitutional:      Appearance: Normal appearance. He is normal weight.  HENT:     Head: Normocephalic.  Eyes:     Extraocular Movements: Extraocular movements intact.  Pulmonary:     Effort: Pulmonary effort is normal.  Musculoskeletal:     Cervical back: Normal.     Thoracic back: Normal.     Lumbar back: Tenderness present. No swelling or spasms. Normal range of motion.       Back:     Comments: Numbness in leg worsened while palpating lower back   Skin:    General: Skin is warm and dry.  Neurological:     Mental Status: He is alert and oriented to person, place, and time. Mental status is at baseline.  Psychiatric:        Mood and Affect: Mood normal.        Behavior: Behavior normal.        Thought Content: Thought content normal.        Judgment: Judgment normal.      UC Treatments / Results  Labs (all labs ordered are listed, but only abnormal results are displayed) Labs Reviewed - No data to display  EKG   Radiology No results found.  Procedures Procedures (including critical care time)  Medications Ordered in UC Medications - No data to display  Initial Impression /  Assessment and Plan / UC Course  I have reviewed the triage vital signs and the nursing notes.  Pertinent labs & imaging results that were available during my care of the patient were reviewed by me and considered in my medical decision making (see chart for details).  Left side lower back with sciatica  1. Naproxen 500 mg bid 2. Flexeril 10 mg bedtime prn 3. Gentle stretching, continue massage and acupuncture if it provides comfort  4.  Orthopedic referral given for persistent pain  Final Clinical Impressions(s) / UC Diagnoses   Final diagnoses:  Acute left-sided low back pain with sciatica, sciatica laterality unspecified     Discharge Instructions     Take naproxen twice a day for the next 5 days then as needed  Can use Flexeril at bedtime as needed for additional comfort. Be mindful this may make you drowsy. May also take half a pill to decrease drowsy effect  As pain decreases gently stretch back and legs   Can follow up with urgent care or orthopedics if symptoms persist or worsen after completing recommended course of medication      ED Prescriptions    Medication Sig Dispense Auth. Provider   cyclobenzaprine (FLEXERIL) 10 MG tablet Take 1 tablet (10 mg total) by mouth 2 (two) times daily as needed for muscle spasms. 20 tablet Charise Leinbach R, NP   naproxen (NAPROSYN) 500 MG tablet Take 1 tablet (500 mg total) by mouth 2 (two) times daily. 30 tablet Valinda Hoar, NP     PDMP not reviewed this encounter.   Valinda Hoar, NP 07/17/20 1400

## 2020-07-17 NOTE — ED Triage Notes (Signed)
Pt presents with recurrent ongoing left hip injury stemming from a old back injury a few years ago.

## 2021-10-30 ENCOUNTER — Other Ambulatory Visit: Payer: Self-pay

## 2021-10-30 ENCOUNTER — Emergency Department (HOSPITAL_COMMUNITY)
Admission: EM | Admit: 2021-10-30 | Discharge: 2021-10-31 | Discharge status: Against Medical Advice | Payer: Self-pay | Attending: Emergency Medicine | Admitting: Emergency Medicine

## 2021-10-30 DIAGNOSIS — K122 Cellulitis and abscess of mouth: Secondary | ICD-10-CM | POA: Insufficient documentation

## 2021-10-30 DIAGNOSIS — Z5321 Procedure and treatment not carried out due to patient leaving prior to being seen by health care provider: Secondary | ICD-10-CM | POA: Insufficient documentation

## 2021-10-30 LAB — COMPREHENSIVE METABOLIC PANEL
ALT: 29 U/L (ref 0–44)
AST: 23 U/L (ref 15–41)
Albumin: 3.9 g/dL (ref 3.5–5.0)
Alkaline Phosphatase: 43 U/L (ref 38–126)
Anion gap: 6 (ref 5–15)
BUN: 13 mg/dL (ref 6–20)
CO2: 27 mmol/L (ref 22–32)
Calcium: 9 mg/dL (ref 8.9–10.3)
Chloride: 108 mmol/L (ref 98–111)
Creatinine, Ser: 1.2 mg/dL (ref 0.61–1.24)
GFR, Estimated: >60 mL/min (ref >60)
Glucose, Bld: 109 mg/dL — ABNORMAL HIGH (ref 70–99)
Potassium: 3.8 mmol/L (ref 3.5–5.1)
Sodium: 141 mmol/L (ref 135–145)
Total Bilirubin: 0.7 mg/dL (ref 0.3–1.2)
Total Protein: 6.3 g/dL — ABNORMAL LOW (ref 6.5–8.1)

## 2021-10-30 LAB — CBC WITH DIFFERENTIAL/PLATELET
Abs Immature Granulocytes: 0.01 10*3/uL (ref 0.00–0.07)
Basophils Absolute: 0 10*3/uL (ref 0.0–0.1)
Basophils Relative: 1 %
Eosinophils Absolute: 0.2 10*3/uL (ref 0.0–0.5)
Eosinophils Relative: 4 %
HCT: 41.5 % (ref 39.0–52.0)
Hemoglobin: 14.1 g/dL (ref 13.0–17.0)
Immature Granulocytes: 0 %
Lymphocytes Relative: 43 %
Lymphs Abs: 2 10*3/uL (ref 0.7–4.0)
MCH: 32.2 pg (ref 26.0–34.0)
MCHC: 34 g/dL (ref 30.0–36.0)
MCV: 94.7 fL (ref 80.0–100.0)
Monocytes Absolute: 0.4 10*3/uL (ref 0.1–1.0)
Monocytes Relative: 9 %
Neutro Abs: 2 10*3/uL (ref 1.7–7.7)
Neutrophils Relative %: 43 %
Platelets: 246 10*3/uL (ref 150–400)
RBC: 4.38 MIL/uL (ref 4.22–5.81)
RDW: 12.7 % (ref 11.5–15.5)
WBC: 4.6 10*3/uL (ref 4.0–10.5)
nRBC: 0 % (ref 0.0–0.2)

## 2021-10-30 NOTE — ED Triage Notes (Signed)
Pt c/o abscess to R jawline first noticed Friday, "steady getting bigger & hurting since." Denies difficulty chewing, states it's "just painful." Denies fever, SHOB, sick symptoms.

## 2021-10-31 ENCOUNTER — Other Ambulatory Visit: Payer: Self-pay

## 2021-10-31 ENCOUNTER — Emergency Department (HOSPITAL_BASED_OUTPATIENT_CLINIC_OR_DEPARTMENT_OTHER)
Admission: EM | Admit: 2021-10-31 | Discharge: 2021-10-31 | Disposition: A | Discharge status: Home / Self Care | Payer: Self-pay | Attending: Emergency Medicine | Admitting: Emergency Medicine

## 2021-10-31 ENCOUNTER — Encounter (HOSPITAL_BASED_OUTPATIENT_CLINIC_OR_DEPARTMENT_OTHER): Payer: Self-pay

## 2021-10-31 ENCOUNTER — Emergency Department (HOSPITAL_BASED_OUTPATIENT_CLINIC_OR_DEPARTMENT_OTHER): Payer: Self-pay

## 2021-10-31 ENCOUNTER — Ambulatory Visit: Payer: Self-pay

## 2021-10-31 DIAGNOSIS — R110 Nausea: Secondary | ICD-10-CM | POA: Insufficient documentation

## 2021-10-31 DIAGNOSIS — L0201 Cutaneous abscess of face: Secondary | ICD-10-CM | POA: Insufficient documentation

## 2021-10-31 MED ORDER — IOHEXOL 300 MG/ML  SOLN
100.0000 mL | Freq: Once | INTRAMUSCULAR | Status: AC | PRN
  Administered 2021-10-31: 75 mL via INTRAVENOUS

## 2021-10-31 MED ORDER — DOXYCYCLINE HYCLATE 100 MG PO CAPS: 100.0000 mg | ORAL_CAPSULE | Freq: Two times a day (BID) | ORAL | 0 refills | Status: DC

## 2021-10-31 MED ORDER — ONDANSETRON 4 MG PO TBDP
4.0000 mg | ORAL_TABLET | Freq: Once | ORAL | Status: AC
  Administered 2021-10-31: 4 mg via ORAL
  Filled 2021-10-31: qty 1

## 2021-10-31 MED ORDER — IBUPROFEN 400 MG PO TABS
600.0000 mg | ORAL_TABLET | Freq: Once | ORAL | Status: AC
  Administered 2021-10-31: 600 mg via ORAL
  Filled 2021-10-31: qty 1

## 2021-10-31 NOTE — ED Provider Notes (Signed)
[facility] EMERGENCY DEPT Provider Note   CSN: [id number] Arrival date & time: 10/31/21  0900     History  Chief Complaint  Patient presents with   Abscess    Anthony Goodman is a 41 y.o. male.  Patient presents with right facial swelling angle of mandible since Friday gradually worsening.  Increased pain and mild nausea.  No fevers or chills.  No history of poor dentition.  No injuries.  Patient is waiting at other emergency room however so busy was not seen by physician.  Patient can open his mouth and tolerate oral liquids.       Home Medications Prior to Admission medications   Medication Sig Start Date End Date Taking? Authorizing Provider  doxycycline (VIBRAMYCIN) 100 MG capsule Take 1 capsule (100 mg total) by mouth 2 (two) times daily. One po bid x 7 days 10/31/21  Yes Blane Ohara, MD  cyclobenzaprine (FLEXERIL) 10 MG tablet Take 1 tablet (10 mg total) by mouth 2 (two) times daily as needed for muscle spasms. 07/17/20   White, Elita Boone, NP  methocarbamol (ROBAXIN) 500 MG tablet Take 1 tablet (500 mg total) by mouth 2 (two) times daily. 12/06/18   Maxwell Caul, PA-C  naproxen (NAPROSYN) 500 MG tablet Take 1 tablet (500 mg total) by mouth 2 (two) times daily. 07/17/20   Valinda Hoar, NP      Allergies    Patient has no known allergies.    Review of Systems   Review of Systems  Constitutional:  Negative for chills and fever.  HENT:  Positive for facial swelling. Negative for congestion.   Eyes:  Negative for visual disturbance.  Respiratory:  Negative for shortness of breath.   Cardiovascular:  Negative for chest pain.  Gastrointestinal:  Negative for abdominal pain and vomiting.  Genitourinary:  Negative for dysuria and flank pain.  Musculoskeletal:  Negative for back pain, neck pain and neck stiffness.  Skin:  Negative for rash.  Neurological:  Negative for light-headedness and headaches.    Physical Exam Updated Vital Signs BP  118/86   Pulse 80   Temp 97.8 F (36.6 C) (Oral)   Resp 16   Wt 83.9 kg   SpO2 100%   BMI 25.80 kg/m  Physical Exam Vitals and nursing note reviewed.  Constitutional:      General: He is not in acute distress.    Appearance: He is well-developed.  HENT:     Head: Normocephalic.     Comments: Patient has swelling and tenderness proximal area 3 cm right lateral ankle of mandible.  No significant work or induration mild fluctuance.  No trismus.  No obvious internal dental abscess or infection.  Mild lymphadenopathy right anterior cervical.  Neck supple.    Mouth/Throat:     Mouth: Mucous membranes are moist.  Eyes:     General:        Right eye: No discharge.        Left eye: No discharge.     Conjunctiva/sclera: Conjunctivae normal.  Neck:     Trachea: No tracheal deviation.  Cardiovascular:     Rate and Rhythm: Normal rate.  Pulmonary:     Effort: Pulmonary effort is normal.  Abdominal:     General: There is no distension.     Palpations: Abdomen is soft.     Tenderness: There is no abdominal tenderness. There is no guarding.  Musculoskeletal:     Cervical back: Normal range of motion and neck  supple. No rigidity.  Skin:    General: Skin is warm.     Capillary Refill: Capillary refill takes less than 2 seconds.     Findings: No rash.  Neurological:     General: No focal deficit present.     Mental Status: He is alert.     Cranial Nerves: No cranial nerve deficit.  Psychiatric:        Mood and Affect: Mood normal.     ED Results / Procedures / Treatments   Labs (all labs ordered are listed, but only abnormal results are displayed) Labs Reviewed - No data to display  EKG None  Radiology CT Soft Tissue Neck W Contrast  Result Date: 10/31/2021 CLINICAL DATA:  41 year old male with right mandible region soft tissue swelling, infection. EXAM: CT NECK WITH CONTRAST TECHNIQUE: Multidetector CT imaging of the neck was performed using the standard protocol following  the bolus administration of intravenous contrast. RADIATION DOSE REDUCTION: This exam was performed according to the departmental dose-optimization program which includes automated exposure control, adjustment of the mA and/or kV according to patient size and/or use of iterative reconstruction technique. CONTRAST:  42mL OMNIPAQUE IOHEXOL 300 MG/ML  SOLN COMPARISON:  None Available. FINDINGS: Pharynx and larynx: Glottis is closed. Other laryngeal soft tissue contours including the epiglottis are within normal limits. Oropharynx effaced at the soft palate, but no tonsillar or adenoid hypertrophy or hyperenhancement is evident. Parapharyngeal and retropharyngeal spaces remain within normal limits. Salivary glands: Negative sublingual space. Submandibular glands appear symmetric and within normal limits. Left parotid gland appears negative. Marked area of clinical concern on the right side corresponds to the level of the lower right parotid space, posterior masticator space and just above the skin marker there is a rounded intermediate density soft tissue mass which appears contiguous with the skin surface measuring 12 x 16 x 16 mm (AP by transverse by CC) see series 2, image 41 and coronal image 64. This appears to indent rather than invade the underlying parotid and masticator spaces. Internally the lesion is heterogeneous but no organized fluid collection or soft tissue gas is identified. There does appear to be regional subcutaneous soft tissue stranding. Thyroid: Negative. Lymph nodes: Cervical lymph nodes remain within normal limits. Vascular: Major vascular structures in the neck and at the skull base appear to be patent, normal aside from some tortuosity. Limited intracranial: Negative. Visualized orbits: Negative. Mastoids and visualized paranasal sinuses: Clear. Skeleton: No acute dental finding. No acute osseous abnormality identified. Small right C7 cervical rib, normal variant. Upper chest: Negative.  IMPRESSION: 1. Marked area of concern corresponds to a round 1.6 cm soft tissue mass which more resembles a Subcutaneous Phlegmon which indents the right parotid and masseter muscle, rather than an exophytic parotid or masticator space mass. It appears contiguous with the dermis. No drainable fluid by CT. 2. Elsewhere negative Neck CT. Electronically Signed   By: Odessa Fleming M.D.   On: 10/31/2021 10:40    Procedures Procedures    Medications Ordered in ED Medications  ibuprofen (ADVIL) tablet 600 mg (600 mg Oral Given 10/31/21 0947)  ondansetron (ZOFRAN-ODT) disintegrating tablet 4 mg (4 mg Oral Given 10/31/21 0947)  iohexol (OMNIPAQUE) 300 MG/ML solution 100 mL (75 mLs Intravenous Contrast Given 10/31/21 1021)    ED Course/ Medical Decision Making/ A&P                           Medical Decision Making Amount and/or Complexity  of Data Reviewed Radiology: ordered.  Risk Prescription drug management.   Patient presents with worsening swelling and tenderness and clinical concern for focal abscess or cyst in the right lateral face area.  Other differentials include parotid inflammation, lymphadenopathy, other.  Plan for CT scan given worsening signs and symptoms.  Blood work was ordered yesterday and I reviewed results normal white count, normal electrolytes, normal hemoglobin.  No signs of sepsis or more severe deep space infection at this time.  CT scan results independently reviewed showing approximately 2 cm phlegmon.  No drainable fluid.  Discussed plan for doxycycline, warm compresses and follow-up with ENT.  Patient comfortable this plan.        Final Clinical Impression(s) / ED Diagnoses Final diagnoses:  Facial abscess    Rx / DC Orders ED Discharge Orders          Ordered    doxycycline (VIBRAMYCIN) 100 MG capsule  2 times daily        10/31/21 1232              Blane Ohara, MD 10/31/21 1239

## 2021-10-31 NOTE — ED Triage Notes (Signed)
Pt presents POV for abscess to Right mandible area just below his ear. Pt first noticed it on Friday and it has gradually gotten bigger. Pt denies any cavities. Pt also reports some nausea this am  Pt was seen at [facility] ED last night, left at 0400 this am without being seen. Blood work collected, unremarkable per chart. EDP Zavitz notified. Verbal order for CT

## 2021-10-31 NOTE — ED Notes (Signed)
Pt upset over the wait time. States "yall going send me a bill for sitting here for seven hours?".

## 2021-10-31 NOTE — Discharge Instructions (Signed)
Take antibiotics as prescribed.  Continue to soak warm water and soap.  Use Tylenol every 4 hours and ibuprofen every 6 as needed for pain.

## 2021-10-31 NOTE — ED Notes (Signed)
Discharge instructions, follow up care, and prescription reviewed and explained, pt verbalized understanding. Pt verbalized understanding and had no further questions on d/c. Pt caox4 and ambulatory.

## 2021-10-31 NOTE — Telephone Encounter (Signed)
     Chief Complaint: Boil to right face at jaw Symptoms: Pain, nausea  Frequency: Friday Pertinent Negatives: Patient denies fever Disposition: [x] ED /[] Urgent Care (no appt availability in office) / [] Appointment(In office/virtual)/ []  [facility]/ [] Home Care/ [] Refused Recommended Disposition /[] [place] [facility]/ []  Follow-up with PCP Additional Notes: No pcp.  Reason for Disposition  Patient sounds very sick or weak to the triager  Answer Assessment - Initial Assessment Questions 1. APPEARANCE of BOIL: "What does the boil look like?"      Round 2. LOCATION: "Where is the boil located?"      Right jaw 3. NUMBER: "How many boils are there?"      1  4. SIZE: "How big is the boil?" (e.g., inches, cm; compare to size of a coin or other object)     Fifty cent piece 5. ONSET: "When did the boil start?"     Friday 6. PAIN: "Is there any pain?" If Yes, ask: "How bad is the pain?"   (Scale 1-10; or mild, moderate, severe)     Severe 7. FEVER: "Do you have a fever?" If Yes, ask: "What is it, how was it measured, and when did it start?"      No 8. SOURCE: "Have you been around anyone with boils or other Staph infections?" "Have you ever had boils before?"     No 9. OTHER SYMPTOMS: "Do you have any other symptoms?" (e.g., shaking chills, weakness, rash elsewhere on body)     Nausea 10. PREGNANCY: "Is there any chance you are pregnant?" "When was your last menstrual period?"       N/a  Protocols used: Boil (Skin Abscess)-A-AH

## 2021-10-31 NOTE — ED Notes (Signed)
Pt stated that he is leaving. 

## 2022-04-03 ENCOUNTER — Institutional Professional Consult (permissible substitution): Payer: Self-pay | Admitting: Plastic Surgery

## 2022-04-24 ENCOUNTER — Encounter: Payer: Self-pay | Admitting: Plastic Surgery

## 2022-04-24 ENCOUNTER — Ambulatory Visit: Payer: Commercial Managed Care - PPO | Admitting: Plastic Surgery

## 2022-04-24 VITALS — BP 159/91 | HR 66 | Ht 71.0 in | Wt 191.0 lb

## 2022-04-24 DIAGNOSIS — L989 Disorder of the skin and subcutaneous tissue, unspecified: Secondary | ICD-10-CM

## 2022-04-24 NOTE — Progress Notes (Signed)
   Referring Provider No referring provider defined for this encounter.   CC:  Chief Complaint  Patient presents with   Advice Only      Anthony Goodman is an 42 y.o. male.  HPI: Anthony Goodman is a 42 year old male who presents today for evaluation of a cyst on the right side of his face.  He states the cyst patient first became noticeable after he shaved his face differently and and developed what he thought was an ingrown hair.  He has had 2 infections in the same area since the initial cyst development.  He would like to have the cyst excised.  No Known Allergies  Outpatient Encounter Medications as of 04/24/2022  Medication Sig   cyclobenzaprine (FLEXERIL) 10 MG tablet Take 1 tablet (10 mg total) by mouth 2 (two) times daily as needed for muscle spasms.   naproxen (NAPROSYN) 500 MG tablet Take 1 tablet (500 mg total) by mouth 2 (two) times daily.   [DISCONTINUED] doxycycline (VIBRAMYCIN) 100 MG capsule Take 1 capsule (100 mg total) by mouth 2 (two) times daily. One po bid x 7 days (Patient not taking: Reported on 04/24/2022)   [DISCONTINUED] methocarbamol (ROBAXIN) 500 MG tablet Take 1 tablet (500 mg total) by mouth 2 (two) times daily. (Patient not taking: Reported on 04/24/2022)   No facility-administered encounter medications on file as of 04/24/2022.     No past medical history on file.  Past Surgical History:  Procedure Laterality Date   HERNIA REPAIR      Family History  Problem Relation Age of Onset   Healthy Mother    Cancer Father     Social History   Social History Narrative   Not on file     Review of Systems General: Denies fevers, chills, weight loss CV: Denies chest pain, shortness of breath, palpitations Skin: Cystic-like area with history of infections on the right side of the face just below the temporomandibular joint.  Physical Exam    04/24/2022   11:17 AM 04/24/2022   11:10 AM 10/31/2021   12:15 PM  Vitals with BMI  Height  5\' 11"    Weight   191 lbs   BMI  93.71   Systolic 696 789 381  Diastolic 91 017 86  Pulse 66 74 80    General:  No acute distress,  Alert and oriented, Non-Toxic, Normal speech and affect Integument: Patient has what feels like an epidermal inclusion cyst over the right cheek just below the temporomandibular joint.  There is a small punctum overlying the cyst consistent with an epidermal inclusion cyst   Assessment/Plan Epidermal inclusion cyst: I recommended the patient that he have the cyst excised since he has already had 2 infections and it is likely that he will continue to have recurrent infections.  He understands that there will be a scar in this area and that is possible that he may not have hair growth in the area of the excision.  We also discussed the possibility that the cyst can recur.  He understands and request that I proceed.  Camillia Herter 04/24/2022, 12:05 PM

## 2022-05-22 ENCOUNTER — Ambulatory Visit: Payer: Commercial Managed Care - PPO | Admitting: Plastic Surgery

## 2022-05-27 ENCOUNTER — Institutional Professional Consult (permissible substitution): Payer: Commercial Managed Care - PPO | Admitting: Plastic Surgery

## 2022-06-04 ENCOUNTER — Institutional Professional Consult (permissible substitution): Payer: Self-pay | Admitting: Plastic Surgery

## 2022-07-05 DIAGNOSIS — Z125 Encounter for screening for malignant neoplasm of prostate: Secondary | ICD-10-CM | POA: Diagnosis not present

## 2022-07-05 DIAGNOSIS — Z0000 Encounter for general adult medical examination without abnormal findings: Secondary | ICD-10-CM | POA: Diagnosis not present

## 2022-07-05 DIAGNOSIS — Z13220 Encounter for screening for lipoid disorders: Secondary | ICD-10-CM | POA: Diagnosis not present

## 2022-12-11 DIAGNOSIS — M545 Low back pain, unspecified: Secondary | ICD-10-CM | POA: Diagnosis not present

## 2022-12-18 DIAGNOSIS — J0140 Acute pansinusitis, unspecified: Secondary | ICD-10-CM | POA: Diagnosis not present

## 2022-12-18 DIAGNOSIS — M5442 Lumbago with sciatica, left side: Secondary | ICD-10-CM | POA: Diagnosis not present

## 2022-12-18 DIAGNOSIS — M545 Low back pain, unspecified: Secondary | ICD-10-CM | POA: Diagnosis not present

## 2023-01-01 DIAGNOSIS — M545 Low back pain, unspecified: Secondary | ICD-10-CM | POA: Diagnosis not present

## 2023-01-28 DIAGNOSIS — M9904 Segmental and somatic dysfunction of sacral region: Secondary | ICD-10-CM | POA: Diagnosis not present

## 2023-01-28 DIAGNOSIS — M9905 Segmental and somatic dysfunction of pelvic region: Secondary | ICD-10-CM | POA: Diagnosis not present

## 2023-01-28 DIAGNOSIS — M5386 Other specified dorsopathies, lumbar region: Secondary | ICD-10-CM | POA: Diagnosis not present

## 2023-01-28 DIAGNOSIS — M9903 Segmental and somatic dysfunction of lumbar region: Secondary | ICD-10-CM | POA: Diagnosis not present

## 2023-01-28 DIAGNOSIS — M9902 Segmental and somatic dysfunction of thoracic region: Secondary | ICD-10-CM | POA: Diagnosis not present

## 2023-02-14 DIAGNOSIS — F1020 Alcohol dependence, uncomplicated: Secondary | ICD-10-CM | POA: Diagnosis not present

## 2023-02-21 DIAGNOSIS — F1020 Alcohol dependence, uncomplicated: Secondary | ICD-10-CM | POA: Diagnosis not present

## 2023-02-28 DIAGNOSIS — F1020 Alcohol dependence, uncomplicated: Secondary | ICD-10-CM | POA: Diagnosis not present

## 2023-03-13 DIAGNOSIS — J069 Acute upper respiratory infection, unspecified: Secondary | ICD-10-CM | POA: Diagnosis not present

## 2023-03-14 DIAGNOSIS — F1020 Alcohol dependence, uncomplicated: Secondary | ICD-10-CM | POA: Diagnosis not present

## 2023-03-21 DIAGNOSIS — F1020 Alcohol dependence, uncomplicated: Secondary | ICD-10-CM | POA: Diagnosis not present

## 2023-04-10 DIAGNOSIS — M5386 Other specified dorsopathies, lumbar region: Secondary | ICD-10-CM | POA: Diagnosis not present

## 2023-04-10 DIAGNOSIS — M9903 Segmental and somatic dysfunction of lumbar region: Secondary | ICD-10-CM | POA: Diagnosis not present

## 2023-04-10 DIAGNOSIS — M9905 Segmental and somatic dysfunction of pelvic region: Secondary | ICD-10-CM | POA: Diagnosis not present

## 2023-04-10 DIAGNOSIS — M9904 Segmental and somatic dysfunction of sacral region: Secondary | ICD-10-CM | POA: Diagnosis not present

## 2023-04-18 ENCOUNTER — Encounter (HOSPITAL_BASED_OUTPATIENT_CLINIC_OR_DEPARTMENT_OTHER): Payer: Self-pay | Admitting: Emergency Medicine

## 2023-04-18 ENCOUNTER — Other Ambulatory Visit: Payer: Self-pay

## 2023-04-18 ENCOUNTER — Emergency Department (HOSPITAL_BASED_OUTPATIENT_CLINIC_OR_DEPARTMENT_OTHER)
Admission: EM | Admit: 2023-04-18 | Discharge: 2023-04-18 | Disposition: A | Discharge status: Home / Self Care | Payer: BC Managed Care – PPO | Attending: Emergency Medicine | Admitting: Emergency Medicine

## 2023-04-18 DIAGNOSIS — J101 Influenza due to other identified influenza virus with other respiratory manifestations: Secondary | ICD-10-CM | POA: Diagnosis not present

## 2023-04-18 DIAGNOSIS — J09X2 Influenza due to identified novel influenza A virus with other respiratory manifestations: Secondary | ICD-10-CM | POA: Insufficient documentation

## 2023-04-18 DIAGNOSIS — Z20822 Contact with and (suspected) exposure to covid-19: Secondary | ICD-10-CM | POA: Insufficient documentation

## 2023-04-18 LAB — RESP PANEL BY RT-PCR (RSV, FLU A&B, COVID)  RVPGX2
Influenza A by PCR: POSITIVE — AB
Influenza B by PCR: NEGATIVE
Resp Syncytial Virus by PCR: NEGATIVE
SARS Coronavirus 2 by RT PCR: NEGATIVE

## 2023-04-18 NOTE — Discharge Instructions (Signed)
You have been seen today for your complaint of flulike symptoms. Your lab work was positive for influenza A. Your discharge medications include Alternate tylenol and ibuprofen for pain and fevers. You may alternate these every 4 hours. You may take up to 800 mg of ibuprofen at a time and up to 1000 mg of tylenol. You may use Claritin and Flonase for congestion and runny nose.  These are over-the-counter medications. You may use Mucinex DM, Delsym or Robitussin DM for your cough if needed. Follow up with: A primary care provider of your choice in 1 week for reevaluation Please seek immediate medical care if you develop any of the following symptoms: You become short of breath or have trouble breathing. Your skin or nails turn blue. You have very bad pain or stiffness in your neck. You get a sudden headache or pain in your face or ear. You vomit each time you eat or drink. At this time there does not appear to be the presence of an emergent medical condition, however there is always the potential for conditions to change. Please read and follow the below instructions.  Do not take your medicine if  develop an itchy rash, swelling in your mouth or lips, or difficulty breathing; call 911 and seek immediate emergency medical attention if this occurs.  You may review your lab tests and imaging results in their entirety on your MyChart account.  Please discuss all results of fully with your primary care provider and other specialist at your follow-up visit.  Note: Portions of this text may have been transcribed using voice recognition software. Every effort was made to ensure accuracy; however, inadvertent computerized transcription errors may still be present.

## 2023-04-18 NOTE — ED Notes (Signed)

## 2023-04-18 NOTE — ED Provider Notes (Signed)
[place] EMERGENCY DEPARTMENT AT [facility] Provider Note   CSN: [id number] Arrival date & time: 04/18/23  1042     History  Chief Complaint  Patient presents with   flu like symptoms    Anthony Goodman is a 43 y.o. male.  Presenting to the ED for evaluation of flulike symptoms.  Symptoms began 3 days ago.  He reports multiple sick contacts at work with the flu.  He did not receive his flu vaccine this year.  States his son is at home with similar symptoms as well.  He reports congestion, rhinorrhea, cough, chills, body aches.  HPI     Home Medications Prior to Admission medications   Medication Sig Start Date End Date Taking? Authorizing Provider  cyclobenzaprine (FLEXERIL) 10 MG tablet Take 1 tablet (10 mg total) by mouth 2 (two) times daily as needed for muscle spasms. 07/17/20   White, Elita Boone, NP  naproxen (NAPROSYN) 500 MG tablet Take 1 tablet (500 mg total) by mouth 2 (two) times daily. 07/17/20   Valinda Hoar, NP      Allergies    Patient has no known allergies.    Review of Systems   Review of Systems  Constitutional:  Positive for chills.  HENT:  Positive for congestion and rhinorrhea.   Musculoskeletal:  Positive for myalgias.  All other systems reviewed and are negative.   Physical Exam Updated Vital Signs BP 119/89 (BP Location: Right Arm)   Pulse 92   Temp 98.5 F (36.9 C) (Oral)   Resp 18   Ht 5\' 11"  (1.803 m)   Wt 86.2 kg   SpO2 99%   BMI 26.50 kg/m  Physical Exam Vitals and nursing note reviewed.  Constitutional:      General: He is not in acute distress.    Appearance: Normal appearance. He is normal weight. He is not ill-appearing.  HENT:     Head: Normocephalic and atraumatic.     Nose: Congestion and rhinorrhea present.  Pulmonary:     Effort: Pulmonary effort is normal. No respiratory distress.     Breath sounds: No wheezing, rhonchi or rales.  Abdominal:     General: Abdomen is flat.  Musculoskeletal:         General: Normal range of motion.     Cervical back: Neck supple.  Skin:    General: Skin is warm and dry.  Neurological:     Mental Status: He is alert and oriented to person, place, and time.  Psychiatric:        Mood and Affect: Mood normal.        Behavior: Behavior normal.     ED Results / Procedures / Treatments   Labs (all labs ordered are listed, but only abnormal results are displayed) Labs Reviewed  RESP PANEL BY RT-PCR (RSV, FLU A&B, COVID)  [id number] - Abnormal; Notable for the following components:      Result Value   Influenza A by PCR POSITIVE (*)    All other components within normal limits    EKG None  Radiology No results found.  Procedures Procedures    Medications Ordered in ED Medications - No data to display  ED Course/ Medical Decision Making/ A&P                                 Medical Decision Making This patient presents to the ED for concern of flulike  symptoms, this involves an extensive number of treatment options, and is a complaint that carries with it a high risk of complications and morbidity.  The differential diagnosis includes flu, COVID, RSV, mononucleosis, other viral URI  My initial workup includes respiratory panel  Additional history obtained from: Nursing notes from this visit.  I ordered, reviewed and interpreted labs which include: Respiratory panel.  Positive for influenza A.  Afebrile, hemodynamically stable.  43 year old male presenting to the ED for evaluation of flulike symptoms.  Symptoms began 3 days ago.  Respiratory panel positive for influenza A.  Likely the source of his symptoms.  He was not vaccinated against influenza this year.  He is outside of the window for Tamiflu.  He was educated on supportive care.  He was encouraged to quarantine until his symptoms improve for 48 hours.  He was given a work note.  He was educated on typical timeline of symptoms.  He was given return precautions.  Stable at discharge.  At  this time there does not appear to be any evidence of an acute emergency medical condition and the patient appears stable for discharge with appropriate outpatient follow up. Diagnosis was discussed with patient who verbalizes understanding of care plan and is agreeable to discharge. I have discussed return precautions with patient who verbalizes understanding. Patient encouraged to follow-up with their PCP within 1 week. All questions answered.  Note: Portions of this report may have been transcribed using voice recognition software. Every effort was made to ensure accuracy; however, inadvertent computerized transcription errors may still be present.         Final Clinical Impression(s) / ED Diagnoses Final diagnoses:  Influenza A    Rx / DC Orders ED Discharge Orders     None         Mora Bellman 04/18/23 1242    Linwood Dibbles, MD 04/19/23 [phone]

## 2023-04-18 NOTE — ED Triage Notes (Signed)
Flu like symptoms x 3 days

## 2023-05-14 DIAGNOSIS — M9905 Segmental and somatic dysfunction of pelvic region: Secondary | ICD-10-CM | POA: Diagnosis not present

## 2023-05-14 DIAGNOSIS — M9904 Segmental and somatic dysfunction of sacral region: Secondary | ICD-10-CM | POA: Diagnosis not present

## 2023-05-14 DIAGNOSIS — M5386 Other specified dorsopathies, lumbar region: Secondary | ICD-10-CM | POA: Diagnosis not present

## 2023-05-14 DIAGNOSIS — M9903 Segmental and somatic dysfunction of lumbar region: Secondary | ICD-10-CM | POA: Diagnosis not present

## 2023-07-09 DIAGNOSIS — Z125 Encounter for screening for malignant neoplasm of prostate: Secondary | ICD-10-CM | POA: Diagnosis not present

## 2023-07-09 DIAGNOSIS — Z0000 Encounter for general adult medical examination without abnormal findings: Secondary | ICD-10-CM | POA: Diagnosis not present

## 2023-10-21 ENCOUNTER — Ambulatory Visit (HOSPITAL_COMMUNITY)
Admission: EM | Admit: 2023-10-21 | Discharge: 2023-10-21 | Disposition: A | Discharge status: Home / Self Care | Attending: Emergency Medicine | Admitting: Emergency Medicine

## 2023-10-21 ENCOUNTER — Other Ambulatory Visit: Payer: Self-pay

## 2023-10-21 ENCOUNTER — Encounter (HOSPITAL_COMMUNITY): Payer: Self-pay | Admitting: Emergency Medicine

## 2023-10-21 DIAGNOSIS — F17200 Nicotine dependence, unspecified, uncomplicated: Secondary | ICD-10-CM

## 2023-10-21 DIAGNOSIS — R030 Elevated blood-pressure reading, without diagnosis of hypertension: Secondary | ICD-10-CM | POA: Diagnosis not present

## 2023-10-21 DIAGNOSIS — R0789 Other chest pain: Secondary | ICD-10-CM

## 2023-10-21 NOTE — ED Notes (Signed)
 Pt is been assess by NP on the UC and is refusing to go to the ED for further evaluation and decided to leave against medical advice. Pt is AO x 4 and able to make his own decision, pt oriented about possible complications he can have for leaving without further evaluation and he express understanding. No acute distress noticed before leaving the UC.

## 2023-10-21 NOTE — ED Triage Notes (Signed)
 Pt arrive c/o left cp radiating to left shoulder that started one hour pta to UC. Denies any dizziness or SOB. Pt states he was shopping when this started.

## 2023-10-21 NOTE — Discharge Instructions (Signed)
 Go to Er for further evaluation of chest pain

## 2023-10-21 NOTE — ED Provider Notes (Signed)
 [facility]    CSN: [id number] Arrival date & time: 10/21/23  1203      History   Chief Complaint Chief Complaint  Patient presents with   Chest Pain    HPI Anthony Goodman is a 43 y.o. male.   43 year old male pt, Anthony Goodman, presents to urgent care for evaluation of wicked left sided chest pain that started 1 hr PTA.  Patient rates the pain as sharp 7/10patient states that the pain radiates to his left shoulder.  Patient denies any palpitations dizziness or shortness of breath.  Patient states he was shopping when the chest pain started.  Patient endorses smoking alcohol use no drug use, patient denies any additional past medical history, denies any past family history.  The history is provided by the patient. No language interpreter was used.    History reviewed. No pertinent past medical history.  Patient Active Problem List   Diagnosis Date Noted   Atypical chest pain 10/21/2023   Smoker 10/21/2023   Elevated blood pressure reading 10/21/2023    Past Surgical History:  Procedure Laterality Date   HERNIA REPAIR         Home Medications    Prior to Admission medications   Medication Sig Start Date End Date Taking? Authorizing Provider  cyclobenzaprine  (FLEXERIL ) 10 MG tablet Take 1 tablet (10 mg total) by mouth 2 (two) times daily as needed for muscle spasms. 07/17/20   White, Shelba SAUNDERS, NP  naproxen  (NAPROSYN ) 500 MG tablet Take 1 tablet (500 mg total) by mouth 2 (two) times daily. 07/17/20   Teresa Shelba SAUNDERS, NP    Family History Family History  Problem Relation Age of Onset   Healthy Mother    Cancer Father     Social History Social History   Tobacco Use   Smoking status: Every Day    Current packs/day: 0.50    Average packs/day: 0.5 packs/day for 10.0 years (5.0 ttl pk-yrs)    Types: Cigarettes   Smokeless tobacco: Never  Vaping Use   Vaping status: Never Used  Substance Use Topics   Alcohol use: Yes    Alcohol/week:  2.0 standard drinks of alcohol    Types: 2 Shots of liquor per week   Drug use: No     Allergies   Patient has no known allergies.   Review of Systems Review of Systems  Constitutional:  Negative for diaphoresis and fever.  Cardiovascular:  Positive for chest pain. Negative for palpitations and leg swelling.  Gastrointestinal:  Negative for diarrhea, nausea and vomiting.  All other systems reviewed and are negative.    Physical Exam Triage Vital Signs ED Triage Vitals  Encounter Vitals Group     BP      Girls Systolic BP Percentile      Girls Diastolic BP Percentile      Boys Systolic BP Percentile      Boys Diastolic BP Percentile      Pulse      Resp      Temp      Temp src      SpO2      Weight      Height      Head Circumference      Peak Flow      Pain Score      Pain Loc      Pain Education      Exclude from Growth Chart    No data found.  Updated  Vital Signs BP (!) 141/80 (BP Location: Right Arm)   Pulse 82   Temp 98.2 F (36.8 C) (Oral)   Resp 18   SpO2 98%   Visual Acuity Right Eye Distance:   Left Eye Distance:   Bilateral Distance:    Right Eye Near:   Left Eye Near:    Bilateral Near:     Physical Exam   UC Treatments / Results  Labs (all labs ordered are listed, but only abnormal results are displayed) Labs Reviewed - No data to display  EKG   Radiology No results found.  Procedures Procedures (including critical care time)  Medications Ordered in UC Medications - No data to display  Initial Impression / Assessment and Plan / UC Course  I have reviewed the triage vital signs and the nursing notes.  Pertinent labs & imaging results that were available during my care of the patient were reviewed by me and considered in my medical decision making (see chart for details).  Clinical Course as of 10/21/23 1352  Tue Oct 21, 2023  1220 EKG shows NSR rate 79, QTC is 399, no ectopy, LVH noted. [JD]    Clinical Course User  Index [JD] Jairy Angulo, Rilla, NP   Discussed exam findings and plan of care with patient, patient states he does not want to go to the emergency room for further evaluation, states if the pain continues he will go to the ER after he drops child off at home.  Patient is alert and oriented, GCS 15, pt wishes to leave AMA, charge nurse notified.  Ddx: Atypical chest pain, ACS,  muscle strain, GERD Final Clinical Impressions(s) / UC Diagnoses   Final diagnoses:  Atypical chest pain  Smoker  Elevated blood pressure reading     Discharge Instructions      Go to Er for further evaluation of chest pain     ED Prescriptions   None    PDMP not reviewed this encounter.   Aminta Rilla, NP 10/21/23 1352
